# Patient Record
Sex: Female | Born: 1935 | Race: White | Hispanic: No | Marital: Married | Smoking: Never smoker
Health system: Southern US, Community
[De-identification: ages and names within clinical notes are randomized; demographics above are authoritative.]

## PROBLEM LIST (undated history)

## (undated) DIAGNOSIS — F329 Major depressive disorder, single episode, unspecified: Secondary | ICD-10-CM

## (undated) DIAGNOSIS — F32A Depression, unspecified: Secondary | ICD-10-CM

## (undated) DIAGNOSIS — M1990 Unspecified osteoarthritis, unspecified site: Secondary | ICD-10-CM

## (undated) DIAGNOSIS — H5440 Blindness, one eye, unspecified eye: Secondary | ICD-10-CM

## (undated) DIAGNOSIS — N189 Chronic kidney disease, unspecified: Secondary | ICD-10-CM

## (undated) DIAGNOSIS — I509 Heart failure, unspecified: Secondary | ICD-10-CM

## (undated) DIAGNOSIS — F419 Anxiety disorder, unspecified: Secondary | ICD-10-CM

## (undated) DIAGNOSIS — E039 Hypothyroidism, unspecified: Secondary | ICD-10-CM

## (undated) HISTORY — PX: CHOLECYSTECTOMY: SHX55

## (undated) HISTORY — PX: ABDOMINAL HYSTERECTOMY: SHX81

## (undated) HISTORY — PX: EYE SURGERY: SHX253

---

## 2004-09-08 ENCOUNTER — Ambulatory Visit: Payer: Self-pay | Admitting: Family Medicine

## 2004-09-21 ENCOUNTER — Ambulatory Visit: Payer: Self-pay | Admitting: Family Medicine

## 2005-11-30 ENCOUNTER — Ambulatory Visit: Payer: Self-pay | Admitting: Family Medicine

## 2006-02-28 ENCOUNTER — Encounter: Payer: Self-pay | Admitting: Family Medicine

## 2006-03-01 ENCOUNTER — Ambulatory Visit: Payer: Self-pay | Admitting: Family Medicine

## 2006-04-14 ENCOUNTER — Ambulatory Visit: Payer: Self-pay

## 2006-07-25 ENCOUNTER — Emergency Department: Payer: Self-pay | Admitting: Emergency Medicine

## 2007-06-01 ENCOUNTER — Ambulatory Visit: Payer: Self-pay

## 2009-02-28 ENCOUNTER — Emergency Department: Payer: Self-pay | Admitting: Emergency Medicine

## 2009-08-19 ENCOUNTER — Ambulatory Visit: Payer: Self-pay | Admitting: General Surgery

## 2009-08-20 ENCOUNTER — Ambulatory Visit: Payer: Self-pay | Admitting: General Surgery

## 2010-02-01 ENCOUNTER — Ambulatory Visit: Payer: Self-pay | Admitting: Family Medicine

## 2011-05-25 ENCOUNTER — Ambulatory Visit: Payer: Self-pay | Admitting: Family Medicine

## 2011-11-24 DIAGNOSIS — H4010X0 Unspecified open-angle glaucoma, stage unspecified: Secondary | ICD-10-CM | POA: Diagnosis not present

## 2011-11-24 DIAGNOSIS — H353290 Exudative age-related macular degeneration, unspecified eye, stage unspecified: Secondary | ICD-10-CM | POA: Diagnosis not present

## 2011-12-27 DIAGNOSIS — H40039 Anatomical narrow angle, unspecified eye: Secondary | ICD-10-CM | POA: Diagnosis not present

## 2012-01-12 DIAGNOSIS — I8310 Varicose veins of unspecified lower extremity with inflammation: Secondary | ICD-10-CM | POA: Diagnosis not present

## 2012-01-12 DIAGNOSIS — F411 Generalized anxiety disorder: Secondary | ICD-10-CM | POA: Diagnosis not present

## 2012-01-12 DIAGNOSIS — R609 Edema, unspecified: Secondary | ICD-10-CM | POA: Diagnosis not present

## 2012-01-12 DIAGNOSIS — E039 Hypothyroidism, unspecified: Secondary | ICD-10-CM | POA: Diagnosis not present

## 2012-01-27 DIAGNOSIS — E039 Hypothyroidism, unspecified: Secondary | ICD-10-CM | POA: Diagnosis not present

## 2012-01-27 DIAGNOSIS — R609 Edema, unspecified: Secondary | ICD-10-CM | POA: Diagnosis not present

## 2012-01-27 DIAGNOSIS — F411 Generalized anxiety disorder: Secondary | ICD-10-CM | POA: Diagnosis not present

## 2012-01-27 DIAGNOSIS — I8310 Varicose veins of unspecified lower extremity with inflammation: Secondary | ICD-10-CM | POA: Diagnosis not present

## 2012-04-14 DIAGNOSIS — Z23 Encounter for immunization: Secondary | ICD-10-CM | POA: Diagnosis not present

## 2013-04-11 DIAGNOSIS — Z23 Encounter for immunization: Secondary | ICD-10-CM | POA: Diagnosis not present

## 2013-06-07 DIAGNOSIS — I8310 Varicose veins of unspecified lower extremity with inflammation: Secondary | ICD-10-CM | POA: Diagnosis not present

## 2013-06-07 DIAGNOSIS — R609 Edema, unspecified: Secondary | ICD-10-CM | POA: Diagnosis not present

## 2013-06-07 DIAGNOSIS — E039 Hypothyroidism, unspecified: Secondary | ICD-10-CM | POA: Diagnosis not present

## 2013-06-07 DIAGNOSIS — F3289 Other specified depressive episodes: Secondary | ICD-10-CM | POA: Diagnosis not present

## 2013-06-07 DIAGNOSIS — F411 Generalized anxiety disorder: Secondary | ICD-10-CM | POA: Diagnosis not present

## 2013-06-07 DIAGNOSIS — F329 Major depressive disorder, single episode, unspecified: Secondary | ICD-10-CM | POA: Diagnosis not present

## 2013-06-07 LAB — TSH: TSH: 6.42 u[IU]/mL — AB (ref <=5.90)

## 2014-01-03 DIAGNOSIS — I8310 Varicose veins of unspecified lower extremity with inflammation: Secondary | ICD-10-CM | POA: Diagnosis not present

## 2014-01-03 DIAGNOSIS — F329 Major depressive disorder, single episode, unspecified: Secondary | ICD-10-CM | POA: Diagnosis not present

## 2014-01-03 DIAGNOSIS — R609 Edema, unspecified: Secondary | ICD-10-CM | POA: Diagnosis not present

## 2014-01-03 DIAGNOSIS — F3289 Other specified depressive episodes: Secondary | ICD-10-CM | POA: Diagnosis not present

## 2014-01-03 DIAGNOSIS — F411 Generalized anxiety disorder: Secondary | ICD-10-CM | POA: Diagnosis not present

## 2014-01-03 DIAGNOSIS — E039 Hypothyroidism, unspecified: Secondary | ICD-10-CM | POA: Diagnosis not present

## 2014-01-17 DIAGNOSIS — F3289 Other specified depressive episodes: Secondary | ICD-10-CM | POA: Diagnosis not present

## 2014-01-17 DIAGNOSIS — R609 Edema, unspecified: Secondary | ICD-10-CM | POA: Diagnosis not present

## 2014-01-17 DIAGNOSIS — E039 Hypothyroidism, unspecified: Secondary | ICD-10-CM | POA: Diagnosis not present

## 2014-01-17 DIAGNOSIS — F411 Generalized anxiety disorder: Secondary | ICD-10-CM | POA: Diagnosis not present

## 2014-01-17 DIAGNOSIS — F329 Major depressive disorder, single episode, unspecified: Secondary | ICD-10-CM | POA: Diagnosis not present

## 2014-01-17 DIAGNOSIS — R195 Other fecal abnormalities: Secondary | ICD-10-CM | POA: Diagnosis not present

## 2014-01-23 DIAGNOSIS — N289 Disorder of kidney and ureter, unspecified: Secondary | ICD-10-CM | POA: Diagnosis not present

## 2014-02-05 DIAGNOSIS — R6889 Other general symptoms and signs: Secondary | ICD-10-CM | POA: Diagnosis not present

## 2014-02-07 DIAGNOSIS — F3289 Other specified depressive episodes: Secondary | ICD-10-CM | POA: Diagnosis not present

## 2014-02-07 DIAGNOSIS — E039 Hypothyroidism, unspecified: Secondary | ICD-10-CM | POA: Diagnosis not present

## 2014-02-07 DIAGNOSIS — F329 Major depressive disorder, single episode, unspecified: Secondary | ICD-10-CM | POA: Diagnosis not present

## 2014-02-07 DIAGNOSIS — F411 Generalized anxiety disorder: Secondary | ICD-10-CM | POA: Diagnosis not present

## 2014-02-07 DIAGNOSIS — R609 Edema, unspecified: Secondary | ICD-10-CM | POA: Diagnosis not present

## 2014-03-07 DIAGNOSIS — E039 Hypothyroidism, unspecified: Secondary | ICD-10-CM | POA: Diagnosis not present

## 2014-03-07 DIAGNOSIS — F411 Generalized anxiety disorder: Secondary | ICD-10-CM | POA: Diagnosis not present

## 2014-03-07 DIAGNOSIS — R609 Edema, unspecified: Secondary | ICD-10-CM | POA: Diagnosis not present

## 2014-03-07 DIAGNOSIS — F329 Major depressive disorder, single episode, unspecified: Secondary | ICD-10-CM | POA: Diagnosis not present

## 2014-03-07 DIAGNOSIS — F3289 Other specified depressive episodes: Secondary | ICD-10-CM | POA: Diagnosis not present

## 2014-03-17 DIAGNOSIS — R5381 Other malaise: Secondary | ICD-10-CM | POA: Diagnosis not present

## 2014-03-17 DIAGNOSIS — M5137 Other intervertebral disc degeneration, lumbosacral region: Secondary | ICD-10-CM | POA: Diagnosis not present

## 2014-03-17 DIAGNOSIS — R5383 Other fatigue: Secondary | ICD-10-CM | POA: Diagnosis not present

## 2014-03-17 DIAGNOSIS — E039 Hypothyroidism, unspecified: Secondary | ICD-10-CM | POA: Diagnosis not present

## 2014-03-17 DIAGNOSIS — R609 Edema, unspecified: Secondary | ICD-10-CM | POA: Diagnosis not present

## 2014-05-14 DIAGNOSIS — Z23 Encounter for immunization: Secondary | ICD-10-CM | POA: Diagnosis not present

## 2014-08-27 DIAGNOSIS — H401414 Capsular glaucoma with pseudoexfoliation of lens, right eye, indeterminate stage: Secondary | ICD-10-CM | POA: Diagnosis not present

## 2014-10-13 DIAGNOSIS — R609 Edema, unspecified: Secondary | ICD-10-CM | POA: Diagnosis not present

## 2014-10-13 DIAGNOSIS — Z8739 Personal history of other diseases of the musculoskeletal system and connective tissue: Secondary | ICD-10-CM | POA: Diagnosis not present

## 2014-10-13 DIAGNOSIS — M79605 Pain in left leg: Secondary | ICD-10-CM | POA: Diagnosis not present

## 2014-10-13 LAB — CBC AND DIFFERENTIAL
HCT: 41 % (ref 36–46)
HEMOGLOBIN: 13.4 g/dL (ref 12.0–16.0)
Platelets: 229 10*3/uL (ref 150–399)
WBC: 4.9 10^3/mL

## 2014-10-13 LAB — BASIC METABOLIC PANEL
BUN: 30 mg/dL — AB (ref 4–21)
CREATININE: 1.2 mg/dL — AB (ref <=1.1)
Glucose: 105 mg/dL
Potassium: 4.8 mmol/L (ref 3.4–5.3)
SODIUM: 139 mmol/L (ref 137–147)

## 2014-10-29 DIAGNOSIS — H401414 Capsular glaucoma with pseudoexfoliation of lens, right eye, indeterminate stage: Secondary | ICD-10-CM | POA: Diagnosis not present

## 2014-11-03 DIAGNOSIS — R609 Edema, unspecified: Secondary | ICD-10-CM | POA: Diagnosis not present

## 2014-11-03 DIAGNOSIS — Z8739 Personal history of other diseases of the musculoskeletal system and connective tissue: Secondary | ICD-10-CM | POA: Diagnosis not present

## 2014-11-03 DIAGNOSIS — M79605 Pain in left leg: Secondary | ICD-10-CM | POA: Diagnosis not present

## 2014-11-19 DIAGNOSIS — N184 Chronic kidney disease, stage 4 (severe): Secondary | ICD-10-CM | POA: Insufficient documentation

## 2014-11-19 DIAGNOSIS — K921 Melena: Secondary | ICD-10-CM | POA: Insufficient documentation

## 2014-11-19 DIAGNOSIS — I872 Venous insufficiency (chronic) (peripheral): Secondary | ICD-10-CM | POA: Insufficient documentation

## 2014-11-19 DIAGNOSIS — M5137 Other intervertebral disc degeneration, lumbosacral region: Secondary | ICD-10-CM | POA: Insufficient documentation

## 2014-11-19 DIAGNOSIS — F32A Depression, unspecified: Secondary | ICD-10-CM | POA: Insufficient documentation

## 2014-11-19 DIAGNOSIS — Z8739 Personal history of other diseases of the musculoskeletal system and connective tissue: Secondary | ICD-10-CM | POA: Insufficient documentation

## 2014-11-19 DIAGNOSIS — M4310 Spondylolisthesis, site unspecified: Secondary | ICD-10-CM | POA: Insufficient documentation

## 2014-11-19 DIAGNOSIS — H5440 Blindness, one eye, unspecified eye: Secondary | ICD-10-CM | POA: Insufficient documentation

## 2014-11-19 DIAGNOSIS — R6889 Other general symptoms and signs: Secondary | ICD-10-CM | POA: Insufficient documentation

## 2014-11-19 DIAGNOSIS — M79605 Pain in left leg: Secondary | ICD-10-CM | POA: Insufficient documentation

## 2014-11-19 DIAGNOSIS — F419 Anxiety disorder, unspecified: Secondary | ICD-10-CM | POA: Insufficient documentation

## 2014-11-19 DIAGNOSIS — F329 Major depressive disorder, single episode, unspecified: Secondary | ICD-10-CM | POA: Insufficient documentation

## 2014-11-19 DIAGNOSIS — M1710 Unilateral primary osteoarthritis, unspecified knee: Secondary | ICD-10-CM | POA: Insufficient documentation

## 2014-11-19 DIAGNOSIS — R609 Edema, unspecified: Secondary | ICD-10-CM | POA: Insufficient documentation

## 2014-11-19 DIAGNOSIS — E039 Hypothyroidism, unspecified: Secondary | ICD-10-CM | POA: Insufficient documentation

## 2015-01-05 ENCOUNTER — Ambulatory Visit (INDEPENDENT_AMBULATORY_CARE_PROVIDER_SITE_OTHER): Payer: Medicare Other | Admitting: Family Medicine

## 2015-01-05 ENCOUNTER — Encounter: Payer: Self-pay | Admitting: Family Medicine

## 2015-01-05 ENCOUNTER — Ambulatory Visit: Payer: Self-pay | Admitting: Family Medicine

## 2015-01-05 VITALS — BP 98/64 | HR 86 | Temp 98.0°F | Resp 16 | Wt 159.0 lb

## 2015-01-05 DIAGNOSIS — M79605 Pain in left leg: Secondary | ICD-10-CM | POA: Diagnosis not present

## 2015-01-05 DIAGNOSIS — Z1389 Encounter for screening for other disorder: Secondary | ICD-10-CM | POA: Diagnosis not present

## 2015-01-05 DIAGNOSIS — M5137 Other intervertebral disc degeneration, lumbosacral region: Secondary | ICD-10-CM

## 2015-01-05 MED ORDER — HYDROCODONE-ACETAMINOPHEN 5-325 MG PO TABS: 1.0000 | ORAL_TABLET | Freq: Three times a day (TID) | ORAL | Status: DC

## 2015-01-05 NOTE — Progress Notes (Signed)
Subjective:    Patient ID: Brandy Schroeder, female    DOB: 1919-02-11, 81 y.o.   MRN: [id number]  HPI Follow-up and Medication Refill Left leg and left lower back continues to have pain and "tireness" with intermittent pain. Still getting good relief from Hydrocodone/APAP 1-2 times a day (usually average once a day). No problems with constipation. Notice some napping daily but doesn't feel it is related to medication. Patient Active Problem List   Diagnosis Date Noted  . Anxiety 11/19/2014  . 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 Degeneration of lumbar or lumbosacral intervertebral disc 11/19/2014  . Clinical depression 11/19/2014  . H/O: osteoarthritis 11/19/2014  . Adult hypothyroidism 11/19/2014  . Left leg pain 11/19/2014  . Primary localized osteoarthrosis, lower leg 11/19/2014  . Edema, peripheral 11/19/2014  . Impaired renal function 11/19/2014  . Acquired spondylolisthesis 11/19/2014  . Dermatitis, stasis 11/19/2014   Family History  Problem Relation Age of Onset  . Arthritis Mother   . Stroke Mother   . Heart disease Brother   . Arthritis Brother    History  Substance Use Topics  . Smoking status: Never Smoker   . Smokeless tobacco: Not on file  . Alcohol Use: No   Current Outpatient Prescriptions on File Prior to Visit  Medication Sig Dispense Refill  . Alpha Lipoic Acid 200 MG CAPS Take 1 capsule by mouth daily.    Marland Kitchen ALPRAZolam (XANAX) 0.5 MG tablet Take by mouth 2 (two) times daily as needed. 1/2 to 1 tablet by mouth twice daily as needed    . Cholecalciferol (VITAMIN D3) 2000 UNITS TABS Take 1 tablet by mouth daily.    . furosemide (LASIX) 40 MG tablet Take 1 tablet by mouth daily.    Marland Kitchen HYDROcodone-acetaminophen (NORCO/VICODIN) 5-325 MG per tablet Take 1 tablet by mouth. Up to three times daily as needed for pain    . potassium chloride SA (K-DUR,KLOR-CON) 20 MEQ tablet Take 1 tablet by mouth 2 (two) times daily.    . traZODone (DESYREL) 150 MG tablet Take 1 tablet  by mouth daily.     No current facility-administered medications on file prior to visit.   Allergies  Allergen Reactions  . Sulfa Antibiotics    Past Surgical History  Procedure Laterality Date  . Eye surgery    . Abdominal hysterectomy    . Cholecystectomy         Review of Systems  Constitutional: Negative.   HENT: Negative.   Eyes: Positive for itching.       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 "Eye doctor prescribes a drop."  Respiratory: Negative.   Cardiovascular: Negative.   Gastrointestinal: Negative.   Musculoskeletal: Positive for back pain.       Back and left foot/leg pain - chronic.       Objective:   Physical Exam  Constitutional: She is oriented to person, place, and time. She appears well-developed and well-nourished. No distress.  HENT:  Head: Normocephalic and atraumatic.  Right Ear: Hearing normal.  Left Ear: Hearing normal.  Nose: Nose normal.  Eyes: Conjunctivae and lids are normal. Right eye exhibits no discharge. Left eye exhibits no discharge. No scleral icterus.  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  Neck: Normal range of motion. Neck supple.  Cardiovascular: Normal rate and regular rhythm.   Pulmonary/Chest: Effort normal and breath sounds normal. No respiratory distress.  Abdominal: Soft. Bowel sounds are normal.  Musculoskeletal:  Left leg shorter than right with intermittent  pains. Normal pulses with slight 1+edema. No numbness.  Neurological: She is alert and oriented to person, place, and time.  Skin: Skin is intact. No lesion and no rash noted.  Psychiatric: She has a normal mood and affect. Her speech is normal and behavior is normal. Thought content normal.   BP 98/64 mmHg  Pulse 86  Temp(Src) 98 F (36.7 C) (Oral)  Resp 16  Wt 159 lb (72.122 kg)     Assessment & Plan:  1. Left leg pain Stable and intermittent. Continues to have good control with use of analgesic one tablet daily with rare second dose.  Denies constipation or confusion. Will refill and advised family to continue walker and watch to prevent falls. Recheck in 3 months. - HYDROcodone-acetaminophen (NORCO/VICODIN) 5-325 MG per tablet; Take 1 tablet by mouth 3 (three) times daily. Up to three times daily as needed for pain  Dispense: 90 tablet; Refill: 0  2. Degeneration of lumbar or lumbosacral intervertebral disc Some soreness in left lower lumbosacral area today. No numbness or radiation at the present. Recheck in 3 months. - HYDROcodone-acetaminophen (NORCO/VICODIN) 5-325 MG per tablet; Take 1 tablet by mouth 3 (three) times daily. Up to three times daily as needed for pain  Dispense: 90 tablet; Refill: 0

## 2015-02-16 ENCOUNTER — Other Ambulatory Visit: Payer: Self-pay | Admitting: Family Medicine

## 2015-02-16 NOTE — Telephone Encounter (Signed)
Last K+ serum lab was 4.8 on 10/13/2014. Pt asking for a K+ prescription refill.

## 2015-03-03 ENCOUNTER — Other Ambulatory Visit: Payer: Self-pay | Admitting: Family Medicine

## 2015-03-05 NOTE — Telephone Encounter (Signed)
Please call in her Xanax and Trazodone to her pharmacy so Claudine Mouton can pick it up for her (family member).

## 2015-03-05 NOTE — Telephone Encounter (Signed)
Called in prescriptions to [facility] as directed below.  Thanks.

## 2015-03-05 NOTE — Telephone Encounter (Signed)
Brandy Schroeder stated she needs the 2 refills sent in asap this morning because she had surgery and it is hard for her to get out. Thanks TNP

## 2015-03-16 ENCOUNTER — Other Ambulatory Visit: Payer: Self-pay | Admitting: Family Medicine

## 2015-03-16 NOTE — Telephone Encounter (Signed)
Pt contacted office for refill request on the following medications:  KLOR-CON M20 20 MEQ.  [facility] [address]  CB#[phone]/MW  This a pt of Dennis.

## 2015-03-16 NOTE — Telephone Encounter (Signed)
Brandy Schroeder patient 

## 2015-03-16 NOTE — Telephone Encounter (Signed)
Brandy patient 

## 2015-04-09 ENCOUNTER — Other Ambulatory Visit: Payer: Self-pay | Admitting: Family Medicine

## 2015-04-10 ENCOUNTER — Other Ambulatory Visit: Payer: Self-pay | Admitting: Family Medicine

## 2015-04-10 NOTE — Telephone Encounter (Signed)
Can work her in today or Monday to recheck and refill medications. By last refill amount, she seems to be using too much of the Xanax.

## 2015-04-10 NOTE — Telephone Encounter (Signed)
Pt's niece Rivka [name] called to request refills on HYDROcodone-acetaminophen (NORCO/VICODIN) 5-325 MG per tablet & ALPRAZolam (XANAX) 0.5 MG tablet for pt. Rivka [name] stated that the pharmacy told pt she needs an OV with our office. Rivka [name] stated that she needs to know as soon as possible if pt needs an OV before she can get her medication. Pt will be out of meds by Monday 04/13/15 and Rivka [name] is her only way to get to the office. Rivka [name] stated that she had to have surgery and it about to have surgery again so she needs a call back as soon as possible and would like to pick up the scripts today if possible. Rivka [name] would like a call back to let her know what she needs to do. Rivka [name] is on pt's DPR. Thanks TNP

## 2015-04-13 NOTE — Telephone Encounter (Signed)
Glenda called back and wants to talk to G[place]ier.  She said she is being misinformed from patient about her prescriptions.     Her call back is [phone]. Or [phone]  Thanks Barth Kirks

## 2015-04-13 NOTE — Telephone Encounter (Signed)
Brandy Schroeder states they finally counted Brandy Schroeder medication and she is NOT over using these prescriptions. She take only 1 of the Norco a day (rarely 2) and 1/2 to 1 of the Xanax tablets a day. She is not needing the refills right now. Advised her to physically count pills to help keep up with supplies and may need to use a pill organizer to limit use to the prescribed amount. Brandy Schroeder is worried about her memory becoming more of a problem. Seems appropriate for a 81 year old. Will come by the office or call back when needed. Recommend recheck appointment in the next 3 months.

## 2015-04-20 ENCOUNTER — Telehealth: Payer: Self-pay | Admitting: Family Medicine

## 2015-04-20 ENCOUNTER — Other Ambulatory Visit: Payer: Self-pay | Admitting: Family Medicine

## 2015-04-20 DIAGNOSIS — F419 Anxiety disorder, unspecified: Secondary | ICD-10-CM

## 2015-04-20 DIAGNOSIS — M5137 Other intervertebral disc degeneration, lumbosacral region: Secondary | ICD-10-CM

## 2015-04-20 DIAGNOSIS — M79605 Pain in left leg: Secondary | ICD-10-CM

## 2015-04-20 MED ORDER — ALPRAZOLAM 0.5 MG PO TABS: ORAL_TABLET | ORAL | Status: DC

## 2015-04-20 MED ORDER — HYDROCODONE-ACETAMINOPHEN 5-325 MG PO TABS: 1.0000 | ORAL_TABLET | Freq: Three times a day (TID) | ORAL | Status: DC

## 2015-04-20 NOTE — Telephone Encounter (Signed)
Pt contacted office for refill request on the following medications: ALPRAZolam (XANAX) 0.5 MG tablet & HYDROcodone-acetaminophen (NORCO/VICODIN) 5-325 MG per tablet. Pt would like to pick up both written RX's. Pt stated that when she contacted [facility] she was told they didn't have an RX for Xanax. Thanks TNP

## 2015-04-20 NOTE — Telephone Encounter (Signed)
Both prescriptions are available for pickup up front

## 2015-04-20 NOTE — Telephone Encounter (Signed)
Refill Request.  

## 2015-04-21 ENCOUNTER — Telehealth: Payer: Self-pay | Admitting: Family Medicine

## 2015-04-21 ENCOUNTER — Other Ambulatory Visit: Payer: Self-pay | Admitting: Family Medicine

## 2015-04-21 NOTE — Telephone Encounter (Signed)
Pt called back about RX. I advised that they were ready for pick up. Thanks TNP

## 2015-04-21 NOTE — Telephone Encounter (Signed)
Brandy Schroeder patient 

## 2015-04-21 NOTE — Telephone Encounter (Signed)
Unable to reach patient at this time, phone continues to ring, will try contacting again later.

## 2015-04-21 NOTE — Telephone Encounter (Signed)
It looks like you had this refilled yesterday... i can't really tell for sure.

## 2015-04-21 NOTE — Telephone Encounter (Signed)
Pt called for a refill on her HYDROcodone-acetaminophen (NORCO/VICODIN) 5-325 MG per tablet 04/20/15 -- Anola Gurney, PA Take 1 tablet by mouth 3 (three) times daily. Up to three times daily as needed for pain   ThanksTeri

## 2015-05-11 ENCOUNTER — Other Ambulatory Visit: Payer: Self-pay | Admitting: Family Medicine

## 2015-05-12 NOTE — Telephone Encounter (Signed)
Pt's niece called saying that they need to get patients potassium refilled.  [facility] pharmacy was suppose to send a request in.  She only has enough until Friday.  Thanks, Barth Kirkseri

## 2015-05-12 NOTE — Telephone Encounter (Signed)
Attempted to contact patient no answer nor voicemail.  

## 2015-05-12 NOTE — Telephone Encounter (Signed)
Need recheck of potassium level and recheck appointment soon. Sent refill to the pharmacy.

## 2015-05-12 NOTE — Telephone Encounter (Signed)
Last ov was on 01/05/2015. Last time this medication refill was on 03/16/2015.  thanks

## 2015-06-01 ENCOUNTER — Other Ambulatory Visit: Payer: Self-pay | Admitting: Family Medicine

## 2015-06-01 NOTE — Telephone Encounter (Signed)
Will write prescription for pick up at the front desk. Remind patient she is due for follow up appointment to recheck labs.

## 2015-06-01 NOTE — Telephone Encounter (Signed)
[facility] called [facility]. Attempted to contact patient. No answer nor voicemail.

## 2015-06-01 NOTE — Telephone Encounter (Signed)
See refill request.

## 2015-06-09 ENCOUNTER — Telehealth: Payer: Self-pay | Admitting: Family Medicine

## 2015-06-09 NOTE — Telephone Encounter (Signed)
Please advise 

## 2015-06-09 NOTE — Telephone Encounter (Signed)
Niece concerned about scratches on patient's leg and tenderness. Recommend she schedule appointment to evaluate for infection and need for antibiotic. She agreed to schedule appointment.

## 2015-06-09 NOTE — Telephone Encounter (Signed)
Pt's niece Rivka BarbaraGlenda would like [name] to return her call. Rivka BarbaraGlenda stated that pt has been scratching her leg and it seems swollen. Glenda wanted to discuss if she should bring pt in for an OV. Please advise. Thanks TNP

## 2015-06-11 ENCOUNTER — Encounter: Payer: Self-pay | Admitting: Family Medicine

## 2015-06-11 ENCOUNTER — Ambulatory Visit (INDEPENDENT_AMBULATORY_CARE_PROVIDER_SITE_OTHER): Payer: Medicare Other | Admitting: Family Medicine

## 2015-06-11 VITALS — BP 150/72 | HR 83 | Temp 97.6°F | Resp 16 | Wt 161.8 lb

## 2015-06-11 DIAGNOSIS — L089 Local infection of the skin and subcutaneous tissue, unspecified: Secondary | ICD-10-CM | POA: Diagnosis not present

## 2015-06-11 DIAGNOSIS — S80812A Abrasion, left lower leg, initial encounter: Secondary | ICD-10-CM | POA: Diagnosis not present

## 2015-06-11 MED ORDER — DOXYCYCLINE HYCLATE 100 MG PO TABS: 100.0000 mg | ORAL_TABLET | Freq: Two times a day (BID) | ORAL | Status: DC

## 2015-06-11 NOTE — Progress Notes (Signed)
Patient ID: Brandy Schroeder [place], female   DOB: 04/03/1919, 81 y.o.   MRN: [id number] Name: Brandy Schroeder Bleau   MRN: [id number]    DOB: 12/11/1918   Date:06/11/2015       Progress Note  Subjective  Chief Complaint  Chief Complaint  Patient presents with  . Stab Wound    left lower leg   HPI Leg Wound: Patient reports wound on lower left leg when she scratched at a sore 2-3 weeks ago. Wound is painful to the touch over the past week. Denies drainage and stopped using Band-Aids recently. History of frequent skin tears. No fever.   Patient Active Problem List   Diagnosis Date Noted  . Anxiety 11/19/2014  . 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 Black stool 11/19/2014  . Degeneration of lumbar or lumbosacral intervertebral disc 11/19/2014  . Clinical depression 11/19/2014  . H/O: osteoarthritis 11/19/2014  . Adult hypothyroidism 11/19/2014  . Left leg pain 11/19/2014  . Does not feel right 11/19/2014  . Primary localized osteoarthrosis, lower leg 11/19/2014  . Edema, peripheral 11/19/2014  . Impaired renal function 11/19/2014  . Acquired spondylolisthesis 11/19/2014  . Dermatitis, stasis 11/19/2014   Past Surgical History  Procedure Laterality Date  . Eye surgery    . Abdominal hysterectomy    . Cholecystectomy     Social History  Substance Use Topics  . Smoking status: Never Smoker   . Smokeless tobacco: Not on file  . Alcohol Use: No   Family History  Problem Relation Age of Onset  . Arthritis Mother   . Stroke Mother   . Heart disease Brother   . Arthritis Brother     Current outpatient prescriptions:  .  Alpha Lipoic Acid 200 MG CAPS, Take 1 capsule by mouth daily., Disp: , Rfl:  .  ALPRAZolam (XANAX) 0.5 MG tablet, TAKE ONE-HALF TO ONE TABLET BY MOUTH TWICE DAILY AS NEEDED, Disp: 60 tablet, Rfl: 0 .  Cholecalciferol (VITAMIN D3) 2000 UNITS TABS, Take 1 tablet by mouth daily., Disp: , Rfl:  .  furosemide (LASIX) 40 MG tablet, TAKE ONE TABLET BY MOUTH ONCE DAILY, Disp: 30  tablet, Rfl: 3 .  HYDROcodone-acetaminophen (NORCO/VICODIN) 5-325 MG per tablet, Take 1 tablet by mouth 3 (three) times daily. Up to three times daily as needed for pain, Disp: 90 tablet, Rfl: 0 .  KLOR-CON M20 20 MEQ tablet, TAKE ONE TABLET BY MOUTH TWICE DAILY, Disp: 60 tablet, Rfl: 0 .  traZODone (DESYREL) 150 MG tablet, TAKE ONE TABLET BY MOUTH ONCE DAILY, Disp: 30 tablet, Rfl: 3  Allergies  Allergen Reactions  . Sulfa Antibiotics    Review of Systems  Constitutional: Negative.   HENT: Negative.   Eyes: Negative.   Respiratory: Negative.   Cardiovascular: Negative.   Gastrointestinal: Negative.   Genitourinary: Negative.   Musculoskeletal: Negative.   Skin: Negative.   Neurological: Negative.   Endo/Heme/Allergies: Negative.    Objective  Filed Vitals:   06/11/15 1134  BP: 150/72  Pulse: 83  Temp: 97.6 F (36.4 C)  TempSrc: Oral  Resp: 16  Weight: 161 lb 12.8 oz (73.392 kg)  SpO2: 95%   Physical Exam  Constitutional: She is oriented to person, place, and time and well-developed, well-nourished, and in no distress.  HENT:  Head: Normocephalic.  Cardiovascular: Normal rate and regular rhythm.   Pulmonary/Chest: Effort normal and breath sounds normal.  Abdominal: Soft. Bowel sounds are normal.  Neurological: She is alert and oriented to person, place, and time.  Skin:  1 x 1.5 cm triangular scab on the lateral side of the left calf with surrounding erythema and bruising. Some puffiness of the left ankle. Negative Homan's sign.  Psychiatric: Affect normal.   Assessment & Plan  1. Infected abrasion of leg, left, initial encounter Onset over the past week after scratching at a scab. Triangular pattern suggests a skin tear. Erythema and swelling of wound indicates infection. Will treat with antibiotic and recommend Coban and gauze dressing to prevent further irritation to her thin skin. Recheck in 1 week if no better. - doxycycline (VIBRA-TABS) 100 MG tablet; Take 1  tablet (100 mg total) by mouth 2 (two) times daily.  Dispense: 20 tablet; Refill: 0

## 2015-06-23 ENCOUNTER — Other Ambulatory Visit: Payer: Self-pay | Admitting: Family Medicine

## 2015-06-23 DIAGNOSIS — M5137 Other intervertebral disc degeneration, lumbosacral region: Secondary | ICD-10-CM

## 2015-06-23 DIAGNOSIS — M79605 Pain in left leg: Secondary | ICD-10-CM

## 2015-06-23 NOTE — Telephone Encounter (Signed)
Pt contacted office for refill request on the following medications:  HYDROcodone-acetaminophen (NORCO/VICODIN) 5-325 MG per tablet.  CB#[phone]/MW

## 2015-06-25 ENCOUNTER — Other Ambulatory Visit: Payer: Self-pay

## 2015-06-25 MED ORDER — HYDROCODONE-ACETAMINOPHEN 5-325 MG PO TABS: 1.0000 | ORAL_TABLET | Freq: Three times a day (TID) | ORAL | Status: DC

## 2015-06-25 NOTE — Telephone Encounter (Signed)
Hydrocodone RX printed at front desk for pick up. Attempted to contact patient. No answer nor voicemail.

## 2015-06-26 NOTE — Telephone Encounter (Signed)
Patient advised.

## 2015-06-29 ENCOUNTER — Other Ambulatory Visit: Payer: Self-pay | Admitting: Family Medicine

## 2015-06-30 NOTE — Telephone Encounter (Signed)
Glenda Called again wanting to know if the Rx had been sent in.  She said they are coming to town tomorrow and need to pick it up then..  She called the pharmacy and they told her to tell us to call it in and it would be faster...  Thanks Fortune Brandsteri

## 2015-06-30 NOTE — Telephone Encounter (Signed)
Pt's niece Glenda called [facility] and they dont have the RX for the trazadone.  Can some one check on this and call her back.    Call back is [phone]  Thanks Barth Kirkseri

## 2015-07-16 ENCOUNTER — Other Ambulatory Visit: Payer: Self-pay | Admitting: Family Medicine

## 2015-07-16 NOTE — Telephone Encounter (Signed)
Brandy Schroeder.  

## 2015-07-16 NOTE — Telephone Encounter (Signed)
Printed, please fax or call in to pharmacy. Thank you.   

## 2015-07-21 ENCOUNTER — Other Ambulatory Visit: Payer: Self-pay | Admitting: Family Medicine

## 2015-09-05 ENCOUNTER — Encounter: Payer: Self-pay | Admitting: Emergency Medicine

## 2015-09-05 ENCOUNTER — Inpatient Hospital Stay
Admission: EM | Admit: 2015-09-05 | Discharge: 2015-09-08 | DRG: 558 | Disposition: A | Discharge status: Skilled Nursing Facility | Payer: Medicare Other | Attending: Internal Medicine | Admitting: Internal Medicine

## 2015-09-05 DIAGNOSIS — I129 Hypertensive chronic kidney disease with stage 1 through stage 4 chronic kidney disease, or unspecified chronic kidney disease: Secondary | ICD-10-CM | POA: Diagnosis present

## 2015-09-05 DIAGNOSIS — Z882 Allergy status to sulfonamides status: Secondary | ICD-10-CM | POA: Diagnosis not present

## 2015-09-05 DIAGNOSIS — T796XXA Traumatic ischemia of muscle, initial encounter: Secondary | ICD-10-CM | POA: Diagnosis not present

## 2015-09-05 DIAGNOSIS — E039 Hypothyroidism, unspecified: Secondary | ICD-10-CM | POA: Diagnosis present

## 2015-09-05 DIAGNOSIS — N183 Chronic kidney disease, stage 3 (moderate): Secondary | ICD-10-CM | POA: Diagnosis not present

## 2015-09-05 DIAGNOSIS — M1990 Unspecified osteoarthritis, unspecified site: Secondary | ICD-10-CM | POA: Diagnosis present

## 2015-09-05 DIAGNOSIS — Z79899 Other long term (current) drug therapy: Secondary | ICD-10-CM | POA: Diagnosis not present

## 2015-09-05 DIAGNOSIS — Z9181 History of falling: Secondary | ICD-10-CM | POA: Diagnosis not present

## 2015-09-05 DIAGNOSIS — H5442 Blindness, left eye, normal vision right eye: Secondary | ICD-10-CM | POA: Diagnosis present

## 2015-09-05 DIAGNOSIS — Z66 Do not resuscitate: Secondary | ICD-10-CM | POA: Diagnosis present

## 2015-09-05 DIAGNOSIS — N179 Acute kidney failure, unspecified: Secondary | ICD-10-CM | POA: Diagnosis present

## 2015-09-05 DIAGNOSIS — F329 Major depressive disorder, single episode, unspecified: Secondary | ICD-10-CM | POA: Diagnosis present

## 2015-09-05 DIAGNOSIS — N189 Chronic kidney disease, unspecified: Secondary | ICD-10-CM | POA: Diagnosis present

## 2015-09-05 DIAGNOSIS — M6281 Muscle weakness (generalized): Secondary | ICD-10-CM | POA: Diagnosis not present

## 2015-09-05 DIAGNOSIS — M6282 Rhabdomyolysis: Secondary | ICD-10-CM | POA: Diagnosis not present

## 2015-09-05 DIAGNOSIS — I480 Paroxysmal atrial fibrillation: Secondary | ICD-10-CM | POA: Diagnosis present

## 2015-09-05 DIAGNOSIS — F419 Anxiety disorder, unspecified: Secondary | ICD-10-CM | POA: Diagnosis present

## 2015-09-05 DIAGNOSIS — F32A Depression, unspecified: Secondary | ICD-10-CM | POA: Diagnosis present

## 2015-09-05 DIAGNOSIS — E860 Dehydration: Secondary | ICD-10-CM | POA: Diagnosis present

## 2015-09-05 DIAGNOSIS — R197 Diarrhea, unspecified: Secondary | ICD-10-CM

## 2015-09-05 DIAGNOSIS — R296 Repeated falls: Secondary | ICD-10-CM | POA: Diagnosis present

## 2015-09-05 DIAGNOSIS — R531 Weakness: Secondary | ICD-10-CM

## 2015-09-05 DIAGNOSIS — I4891 Unspecified atrial fibrillation: Secondary | ICD-10-CM | POA: Diagnosis not present

## 2015-09-05 HISTORY — DX: Anxiety disorder, unspecified: F41.9

## 2015-09-05 HISTORY — DX: Major depressive disorder, single episode, unspecified: F32.9

## 2015-09-05 HISTORY — DX: Chronic kidney disease, unspecified: N18.9

## 2015-09-05 HISTORY — DX: Depression, unspecified: F32.A

## 2015-09-05 HISTORY — DX: Hypothyroidism, unspecified: E03.9

## 2015-09-05 HISTORY — DX: Blindness, one eye, unspecified eye: H54.40

## 2015-09-05 HISTORY — DX: Unspecified osteoarthritis, unspecified site: M19.90

## 2015-09-05 LAB — CBC WITH DIFFERENTIAL/PLATELET
BASOS ABS: 0 10*3/uL (ref 0–0.1)
Basophils Relative: 0 %
EOS PCT: 0 %
Eosinophils Absolute: 0 10*3/uL (ref 0–0.7)
HEMATOCRIT: 35.2 % (ref 35.0–47.0)
Hemoglobin: 11.7 g/dL — ABNORMAL LOW (ref 12.0–16.0)
LYMPHS PCT: 7 %
Lymphs Abs: 0.4 10*3/uL — ABNORMAL LOW (ref 1.0–3.6)
MCH: 30.4 pg (ref 26.0–34.0)
MCHC: 33.3 g/dL (ref 32.0–36.0)
MCV: 91.1 fL (ref 80.0–100.0)
MONO ABS: 0.6 10*3/uL (ref 0.2–0.9)
MONOS PCT: 12 %
Neutro Abs: 4.2 10*3/uL (ref 1.4–6.5)
Neutrophils Relative %: 81 %
PLATELETS: 136 10*3/uL — AB (ref 150–440)
RBC: 3.86 MIL/uL (ref 3.80–5.20)
RDW: 13.8 % (ref 11.5–14.5)
WBC: 5.2 10*3/uL (ref 3.6–11.0)

## 2015-09-05 LAB — URINALYSIS COMPLETE WITH MICROSCOPIC (ARMC ONLY)
BILIRUBIN URINE: NEGATIVE
GLUCOSE, UA: NEGATIVE mg/dL
LEUKOCYTES UA: NEGATIVE
NITRITE: NEGATIVE
PH: 5 (ref 5.0–8.0)
Protein, ur: 100 mg/dL — AB
Specific Gravity, Urine: 1.018 (ref 1.005–1.030)

## 2015-09-05 LAB — GASTROINTESTINAL PANEL BY PCR, STOOL (REPLACES STOOL CULTURE)
ADENOVIRUS F40/41: NOT DETECTED
ASTROVIRUS: NOT DETECTED
CYCLOSPORA CAYETANENSIS: NOT DETECTED
Campylobacter species: NOT DETECTED
Cryptosporidium: NOT DETECTED
E. COLI O157: NOT DETECTED
ENTAMOEBA HISTOLYTICA: NOT DETECTED
ENTEROAGGREGATIVE E COLI (EAEC): NOT DETECTED
ENTEROPATHOGENIC E COLI (EPEC): NOT DETECTED
ENTEROTOXIGENIC E COLI (ETEC): NOT DETECTED
GIARDIA LAMBLIA: NOT DETECTED
NOROVIRUS GI/GII: NOT DETECTED
Plesimonas shigelloides: NOT DETECTED
Rotavirus A: NOT DETECTED
SAPOVIRUS (I, II, IV, AND V): NOT DETECTED
Salmonella species: NOT DETECTED
Shiga like toxin producing E coli (STEC): NOT DETECTED
Shigella/Enteroinvasive E coli (EIEC): NOT DETECTED
VIBRIO CHOLERAE: NOT DETECTED
VIBRIO SPECIES: NOT DETECTED
Yersinia enterocolitica: NOT DETECTED

## 2015-09-05 LAB — COMPREHENSIVE METABOLIC PANEL
ALT: 26 U/L (ref 14–54)
ANION GAP: 12 (ref 5–15)
AST: 118 U/L — ABNORMAL HIGH (ref 15–41)
Albumin: 3.9 g/dL (ref 3.5–5.0)
Alkaline Phosphatase: 45 U/L (ref 38–126)
BUN: 32 mg/dL — ABNORMAL HIGH (ref 6–20)
CHLORIDE: 101 mmol/L (ref 101–111)
CO2: 20 mmol/L — AB (ref 22–32)
Calcium: 8.3 mg/dL — ABNORMAL LOW (ref 8.9–10.3)
Creatinine, Ser: 1.61 mg/dL — ABNORMAL HIGH (ref 0.44–1.00)
GFR, EST AFRICAN AMERICAN: 30 mL/min — AB (ref >60)
GFR, EST NON AFRICAN AMERICAN: 26 mL/min — AB (ref >60)
Glucose, Bld: 143 mg/dL — ABNORMAL HIGH (ref 65–99)
POTASSIUM: 3.4 mmol/L — AB (ref 3.5–5.1)
SODIUM: 133 mmol/L — AB (ref 135–145)
Total Bilirubin: 0.9 mg/dL (ref 0.3–1.2)
Total Protein: 6.7 g/dL (ref 6.5–8.1)

## 2015-09-05 LAB — LIPASE, BLOOD: LIPASE: 17 U/L (ref 11–51)

## 2015-09-05 LAB — C DIFFICILE QUICK SCREEN W PCR REFLEX
C DIFFICILE (CDIFF) INTERP: NEGATIVE
C DIFFICILE (CDIFF) TOXIN: NEGATIVE
C Diff antigen: NEGATIVE

## 2015-09-05 LAB — CK: CK TOTAL: 4777 U/L — AB (ref 38–234)

## 2015-09-05 MED ORDER — ACETAMINOPHEN 325 MG PO TABS
650.0000 mg | ORAL_TABLET | Freq: Once | ORAL | Status: AC
  Administered 2015-09-05: 650 mg via ORAL
  Filled 2015-09-05: qty 2

## 2015-09-05 NOTE — ED Notes (Signed)
Per EMS patient has had multiple fall over the past 3 days. Patient has also had diarrhea for the past 3 days and has had decreased PO in take.

## 2015-09-05 NOTE — Care Management Obs Status (Signed)
MEDICARE OBSERVATION STATUS NOTIFICATION   Patient Details  Name: Brandy Schroeder MRN: [id number] Date of Birth: 1918/09/04   Medicare Observation Status Notification Given:  Yes    CrutchfieldDerrill Memo, RN 09/05/2015, 10:26 PM

## 2015-09-05 NOTE — ED Notes (Signed)
Called to give report, charge nurse unavailable, will call back with update on room.

## 2015-09-05 NOTE — ED Notes (Signed)
Patient brought in from [facility] by EMS for unwitnessed fall. Patient has also had diarrhea for 3 days.

## 2015-09-05 NOTE — H&P (Signed)
[facility] - [place] [facility]   PATIENT NAME: Brandy Schroeder    MR#:  [id number]  DATE OF BIRTH:  09-Apr-1919  DATE OF ADMISSION:  09/05/2015  PRIMARY CARE PHYSICIAN: Dortha Kern, PA   REQUESTING/REFERRING PHYSICIAN: Shaune Pollack, M.D.  CHIEF COMPLAINT:   Chief Complaint  Patient presents with  . Fall  . Diarrhea    HISTORY OF PRESENT ILLNESS:  Brandy Schroeder  is a 82 y.o. female who presents with multiple falls. Patient states that she's been having increasing falls with the past couple weeks, with 2 falls in the last 2 days. Her son is present with her in the ED and provides further information. He states that she lives in assisted living facility, and that he was called by staff there that she had fallen today. By time he got there they had gotten her up, and she had fallen again. She was brought to the ED for evaluation. When she arrived here she was found to be in atrial fibrillation with RVR, but then spontaneously converted to sinus rhythm. Patient is mildly dehydrated. CPK was elevated at 4700. On arrival patient was also mildly febrile, though her white blood cell count is not elevated. Her chest x-ray did not show any infiltrate, and her urinalysis did not indicate infection. She does state that she had an episode of watery diarrhea yesterday. Hospitalists were called for admission.  PAST MEDICAL HISTORY:   Past Medical History  Diagnosis Date  . Anxiety   . Blind left eye   . Depression   . Hypothyroidism   . OA (osteoarthritis)   . CKD (chronic kidney disease)     PAST SURGICAL HISTORY:   Past Surgical History  Procedure Laterality Date  . Eye surgery    . Abdominal hysterectomy    . Cholecystectomy      SOCIAL HISTORY:   Social History  Substance Use Topics  . Smoking status: Never Smoker   . Smokeless tobacco: Not on file  . Alcohol Use: No    FAMILY HISTORY:   Family History  Problem Relation Age of Onset  .  Arthritis Mother   . Stroke Mother   . Heart disease Brother   . Arthritis Brother     DRUG ALLERGIES:   Allergies  Allergen Reactions  . Sulfa Antibiotics     MEDICATIONS AT HOME:   Prior to Admission medications   Medication Sig Start Date End Date Taking? Authorizing Provider  Alpha Lipoic Acid 200 MG CAPS Take 1 capsule by mouth daily.    Historical Provider, MD  ALPRAZolam Prudy Feeler) 0.5 MG tablet TAKE ONE-HALF TO ONE TABLET BY MOUTH TWICE DAILY AS NEEDED 07/16/15   Lorie Phenix, MD  Cholecalciferol (VITAMIN D3) 2000 UNITS TABS Take 1 tablet by mouth daily.    Historical Provider, MD  doxycycline (VIBRA-TABS) 100 MG tablet Take 1 tablet (100 mg total) by mouth 2 (two) times daily. 06/11/15   Jodell Cipro Chrismon, PA  furosemide (LASIX) 40 MG tablet TAKE ONE TABLET BY MOUTH ONCE DAILY 06/01/15   Jodell Cipro Chrismon, PA  HYDROcodone-acetaminophen (NORCO/VICODIN) 5-325 MG tablet Take 1 tablet by mouth 3 (three) times daily. Up to three times daily as needed for pain 06/25/15   Jodell Cipro Chrismon, PA  KLOR-CON M20 20 MEQ tablet TAKE ONE TABLET BY MOUTH TWICE DAILY 06/25/15   Jodell Cipro Chrismon, PA  traZODone (DESYREL) 150 MG tablet TAKE ONE TABLET BY MOUTH ONCE DAILY 06/30/15   Tamsen Roers, PA  REVIEW OF SYSTEMS:  Review of Systems  Constitutional: Positive for fever. Negative for chills, weight loss and malaise/fatigue.  HENT: Negative for ear pain, hearing loss and tinnitus.   Eyes: Negative for blurred vision, double vision, pain and redness.  Respiratory: Negative for cough, hemoptysis and shortness of breath.   Cardiovascular: Negative for chest pain, palpitations, orthopnea and leg swelling.  Gastrointestinal: Positive for diarrhea. Negative for nausea, vomiting, abdominal pain and constipation.  Genitourinary: Negative for dysuria, frequency and hematuria.  Musculoskeletal: Positive for falls. Negative for back pain, joint pain and neck pain.  Skin:       No acne, rash, or  lesions  Neurological: Negative for dizziness, tremors, focal weakness and weakness.  Endo/Heme/Allergies: Negative for polydipsia. Does not bruise/bleed easily.  Psychiatric/Behavioral: Negative for depression. The patient is not nervous/anxious and does not have insomnia.      VITAL SIGNS:   Filed Vitals:   09/05/15 2030 09/05/15 2053 09/05/15 2100 09/05/15 2130  BP: 136/80  159/72 143/77  Pulse: 106  104 101  Temp:  100.1 F (37.8 C)    TempSrc:  Oral    Resp: 26  24 23   Height:      Weight:      SpO2: 94%  94% 96%   Wt Readings from Last 3 Encounters:  09/05/15 73.029 kg (161 lb)  06/11/15 73.392 kg (161 lb 12.8 oz)  01/05/15 72.122 kg (159 lb)    PHYSICAL EXAMINATION:  Physical Exam  Vitals reviewed. Constitutional: She is oriented to person, place, and time. She appears well-developed and well-nourished. No distress.  HENT:  Head: Normocephalic and atraumatic.  dry mucous membranes  Eyes: Conjunctivae and EOM are normal. Pupils are equal, round, and reactive to light. No scleral icterus.  Neck: Normal range of motion. Neck supple. No JVD present. No thyromegaly present.  Cardiovascular: Normal rate, regular rhythm and intact distal pulses.  Exam reveals no gallop and no friction rub.   No murmur heard. Respiratory: Effort normal and breath sounds normal. No respiratory distress. She has no wheezes. She has no rales.  GI: Soft. Bowel sounds are normal. She exhibits no distension. There is no tenderness.  Musculoskeletal: Normal range of motion. She exhibits no edema.  No arthritis, no gout  Lymphadenopathy:    She has no cervical adenopathy.  Neurological: She is alert and oriented to person, place, and time. No cranial nerve deficit.  No dysarthria, no aphasia  Skin: Skin is warm and dry. No rash noted. No erythema.  Scattered ecchymoses  Psychiatric: She has a normal mood and affect. Her behavior is normal. Judgment and thought content normal.    LABORATORY  PANEL:   CBC  Recent Labs Lab 09/05/15 1807  WBC 5.2  HGB 11.7*  HCT 35.2  PLT 136*   ------------------------------------------------------------------------------------------------------------------  Chemistries   Recent Labs Lab 09/05/15 1807  NA 133*  K 3.4*  CL 101  CO2 20*  GLUCOSE 143*  BUN 32*  CREATININE 1.61*  CALCIUM 8.3*  AST 118*  ALT 26  ALKPHOS 45  BILITOT 0.9   ------------------------------------------------------------------------------------------------------------------  Cardiac Enzymes No results for input(s): TROPONINI in the last 168 hours. ------------------------------------------------------------------------------------------------------------------  RADIOLOGY:  No results found.  EKG:   Orders placed or performed during the hospital encounter of 09/05/15  . ED EKG  . ED EKG  . EKG 12-Lead  . EKG 12-Lead    IMPRESSION AND PLAN:  Principal Problem:   Multiple falls - unclear initial etiology. There is a  new diagnosis of A. fib as below. She also mildly dehydrated on clinical exam with perhaps some orthostatic hypotension. Potentially some weakness after her initial falls due to her rhabdo. We'll have PT evaluate. Treat other problems as below Active Problems:   Rhabdomyolysis - continue fluids for now, trend CPK and renal function.   Anxiety - continue home meds   Paroxysmal a-fib (HCC) - currently in sinus rhythm. No prior diagnosis of the same. Monitor on telemetry. Echocardiogram. Pending echo results, might consider cardiology consult   Acute on chronic renal failure (HCC) - likely secondary to her rhabdo, avoid nephrotoxins, fluids as above, monitor.   Clinical depression - continue home meds   Adult hypothyroidism - continue home meds  All the records are reviewed and case discussed with ED provider. Management plans discussed with the patient and/or family.  DVT PROPHYLAXIS: SubQ heparin  GI PROPHYLAXIS:  None  ADMISSION STATUS: Inpatient  CODE STATUS: DNR Code Status History    This patient does not have a recorded code status. Please follow your organizational policy for patients in this situation.      TOTAL TIME TAKING CARE OF THIS PATIENT: 45 minutes.    Airam Heidecker FIELDING 09/05/2015, 10:25 PM  [facility]  Office  [phone]  CC: Primary care physician; Dortha Kern, PA

## 2015-09-05 NOTE — ED Notes (Signed)
Sterile strip applied to lt. Forarm, previous IV site.  Skin laceration 1 cm, no bleeding.

## 2015-09-05 NOTE — ED Provider Notes (Signed)
[facility] Emergency Department Provider Note   ____________________________________________  Time seen: Approximately 8:30 pm I have reviewed the triage vital signs and the triage nursing note.  HISTORY  Chief Complaint Fall and Diarrhea   Historian Patient, sister and son  HPI Brandy Schroeder is a 82 y.o. female who lives at an assisted living facility on her own, has had diarrhea for the past 3 days and increased generalized weakness with multiple falls over this time period. No significant nausea or vomiting or abdominal pain. No black or bloody stools. Watery diarrhea many times per day. It sounds like multiple other folks have the same infectious diarrhea right now. No reported fever. No trouble breathing. No altered mental status. The falls have resulted in injury or any head injury. She typically leans on her bottom because her legs are weak. Sometimes are moderate.    No past medical history on file.  Patient Active Problem List   Diagnosis Date Noted  . Anxiety 11/19/2014  . 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 Black stool 11/19/2014  . Degeneration of lumbar or lumbosacral intervertebral disc 11/19/2014  . Clinical depression 11/19/2014  . H/O: osteoarthritis 11/19/2014  . Adult hypothyroidism 11/19/2014  . Left leg pain 11/19/2014  . Does not feel right 11/19/2014  . Primary localized osteoarthrosis, lower leg 11/19/2014  . Edema, peripheral 11/19/2014  . Impaired renal function 11/19/2014  . Acquired spondylolisthesis 11/19/2014  . Dermatitis, stasis 11/19/2014    Past Surgical History  Procedure Laterality Date  . Eye surgery    . Abdominal hysterectomy    . Cholecystectomy      Current Outpatient Rx  Name  Route  Sig  Dispense  Refill  . Alpha Lipoic Acid 200 MG CAPS   Oral   Take 1 capsule by mouth daily.         Marland Kitchen ALPRAZolam (XANAX) 0.5 MG tablet      TAKE ONE-HALF TO ONE TABLET BY MOUTH TWICE DAILY AS NEEDED   60  tablet   0   . Cholecalciferol (VITAMIN D3) 2000 UNITS TABS   Oral   Take 1 tablet by mouth daily.         Marland Kitchen doxycycline (VIBRA-TABS) 100 MG tablet   Oral   Take 1 tablet (100 mg total) by mouth 2 (two) times daily.   20 tablet   0   . furosemide (LASIX) 40 MG tablet      TAKE ONE TABLET BY MOUTH ONCE DAILY   30 tablet   3   . HYDROcodone-acetaminophen (NORCO/VICODIN) 5-325 MG tablet   Oral   Take 1 tablet by mouth 3 (three) times daily. Up to three times daily as needed for pain   90 tablet   0   . KLOR-CON M20 20 MEQ tablet      TAKE ONE TABLET BY MOUTH TWICE DAILY   60 tablet   5   . traZODone (DESYREL) 150 MG tablet      TAKE ONE TABLET BY MOUTH ONCE DAILY   30 tablet   3     Allergies Sulfa antibiotics  Family History  Problem Relation Age of Onset  . Arthritis Mother   . Stroke Mother   . Heart disease Brother   . Arthritis Brother     Social History Social History  Substance Use Topics  . Smoking status: Never Smoker   . Smokeless tobacco: None  . Alcohol Use: No    Review of Systems  Constitutional:  Negative for fever. Eyes: Negative for visual changes. ENT: Negative for sore throat. Cardiovascular: Negative for chest pain. Respiratory: Negative for shortness of breath. Gastrointestinal: Negative for abdominal pain Genitourinary: Negative for dysuria. Musculoskeletal: Negative for back pain. Skin: Negative for rash. Neurological: Negative for headache. 10 point Review of Systems otherwise negative ____________________________________________   PHYSICAL EXAM:  VITAL SIGNS: ED Triage Vitals  Enc Vitals Group     BP 09/05/15 1801 158/76 mmHg     Pulse Rate 09/05/15 1801 116     Resp 09/05/15 1801 20     Temp 09/05/15 1801 100.8 F (38.2 C)     Temp Source 09/05/15 1801 Rectal     SpO2 09/05/15 1801 95 %     Weight 09/05/15 1801 161 lb (73.029 kg)     Height 09/05/15 1801  (1.651 m)     Head Cir --      Peak Flow --       Pain Score 09/05/15 1752 0     Pain Loc --      Pain Edu? --      Excl. in GC? --      Constitutional: Alert and oriented. Well appearing and in no distress. HEENT   Head: Normocephalic and atraumatic.      Eyes: Conjunctivae are normal. PERRL. Normal extraocular movements.      Ears:         Nose: No congestion/rhinnorhea.   Mouth/Throat: Mucous membranes are moist.   Neck: No stridor. Cardiovascular/Chest: Tachycardic and irregularly irregular..  No murmurs, rubs, or gallops. Respiratory: Normal respiratory effort without tachypnea nor retractions. Breath sounds are clear and equal bilaterally. No wheezes/rales/rhonchi. Gastrointestinal: Soft. No distention, no guarding, no rebound. Nontender.    Genitourinary/rectal:Deferred Musculoskeletal: Nontender with normal range of motion in all extremities. No joint effusions.  No lower extremity tenderness.  No edema. Neurologic:  Normal speech and language. No gross or focal neurologic deficits are appreciated. Skin:  Skin is warm, dry and intact. No rash noted. Psychiatric: Mood and affect are normal. Speech and behavior are normal. Patient exhibits appropriate insight and judgment.  ____________________________________________   EKG I, Governor Rooks, MD, the attending physician have personally viewed and interpreted all ECGs.  1 15 bpm. Undetermined rhythm, but appears to be sinus tachycardia with PVC. Narrow QRS. Normal axis. Nonspecific ST and T-wave ____________________________________________  LABS (pertinent positives/negatives)  Lipase 17 Comprehensive metabolic panel significant for sodium 133, potassium 3.4, CO2 20, BUN 32 and creatinine 1.61 Urinalysis rare bacteria otherwise negative for signs of infection. Trace ketones C. difficile negative White blood count 5.2, hemoglobin 11.7 and platelet count 136  ____________________________________________  RADIOLOGY All Xrays were viewed by me. Imaging  interpreted by Radiologist.  None __________________________________________  PROCEDURES  Procedure(s) performed: None  Critical Care performed: None  ____________________________________________   ED COURSE / ASSESSMENT AND PLAN  Pertinent labs & imaging results that were available during my care of the patient were reviewed by me and considered in my medical decision making (see chart for details).   It sounds like her diarrhea is probably a viral infectious given what's going around in the community and at her facility. She doesn't have fever here and her blood pressure has been up. No hypotension. Labs show mild acute renal failure with mildly low sodium and potassium. She was given IV fluid bolus here in the emergency department. She still remains extremely weak generally without focal weakness. I do think she needs hospital observation admission for fluid  hydration, and likely physical therapy evaluation for generalized weakness.  On orthostatic vitals her heart rate did go up, however she is hypertensive. Her temp was 100.1 and she was given Tylenol. She had trouble standing due to generalized weakness.    CONSULTATIONS:   Hospitalist for admission   Patient / Family / Caregiver informed of clinical course, medical decision-making process, and agree with plan.     ___________________________________________   FINAL CLINICAL IMPRESSION(S) / ED DIAGNOSES   Final diagnoses:  Diarrhea, unspecified type  Generalized weakness  Multiple falls              Note: This dictation was prepared with Dragon dictation. Any transcriptional errors that result from this process are unintentional   Governor Rooks, MD 09/05/15 2102

## 2015-09-06 ENCOUNTER — Inpatient Hospital Stay: Admit: 2015-09-06 | Payer: Medicare Other

## 2015-09-06 LAB — BASIC METABOLIC PANEL
ANION GAP: 6 (ref 5–15)
BUN: 30 mg/dL — ABNORMAL HIGH (ref 6–20)
CO2: 24 mmol/L (ref 22–32)
Calcium: 8.2 mg/dL — ABNORMAL LOW (ref 8.9–10.3)
Chloride: 109 mmol/L (ref 101–111)
Creatinine, Ser: 1.24 mg/dL — ABNORMAL HIGH (ref 0.44–1.00)
GFR calc Af Amer: 42 mL/min — ABNORMAL LOW (ref >60)
GFR, EST NON AFRICAN AMERICAN: 36 mL/min — AB (ref >60)
Glucose, Bld: 93 mg/dL (ref 65–99)
POTASSIUM: 3.6 mmol/L (ref 3.5–5.1)
SODIUM: 139 mmol/L (ref 135–145)

## 2015-09-06 LAB — TSH: TSH: 3.399 u[IU]/mL (ref 0.350–4.500)

## 2015-09-06 LAB — CBC
HEMATOCRIT: 35.3 % (ref 35.0–47.0)
HEMOGLOBIN: 12 g/dL (ref 12.0–16.0)
MCH: 30.5 pg (ref 26.0–34.0)
MCHC: 33.9 g/dL (ref 32.0–36.0)
MCV: 89.9 fL (ref 80.0–100.0)
Platelets: 140 10*3/uL — ABNORMAL LOW (ref 150–440)
RBC: 3.93 MIL/uL (ref 3.80–5.20)
RDW: 14 % (ref 11.5–14.5)
WBC: 4.3 10*3/uL (ref 3.6–11.0)

## 2015-09-06 LAB — MRSA PCR SCREENING: MRSA BY PCR: NEGATIVE

## 2015-09-06 LAB — CK: Total CK: 5360 U/L — ABNORMAL HIGH (ref 38–234)

## 2015-09-06 MED ORDER — ONDANSETRON HCL 4 MG PO TABS: 4.0000 mg | ORAL_TABLET | Freq: Four times a day (QID) | ORAL | Status: DC | PRN

## 2015-09-06 MED ORDER — SODIUM CHLORIDE 0.9% FLUSH
3.0000 mL | Freq: Two times a day (BID) | INTRAVENOUS | Status: DC
  Administered 2015-09-06 – 2015-09-07 (×2): 3 mL via INTRAVENOUS

## 2015-09-06 MED ORDER — HEPARIN SODIUM (PORCINE) 5000 UNIT/ML IJ SOLN
5000.0000 [IU] | Freq: Three times a day (TID) | INTRAMUSCULAR | Status: DC
  Administered 2015-09-06 – 2015-09-08 (×7): 5000 [IU] via SUBCUTANEOUS
  Filled 2015-09-06 (×7): qty 1

## 2015-09-06 MED ORDER — TRAZODONE HCL 50 MG PO TABS
150.0000 mg | ORAL_TABLET | Freq: Every day | ORAL | Status: DC
  Administered 2015-09-06 – 2015-09-07 (×2): 150 mg via ORAL
  Filled 2015-09-06 (×3): qty 1

## 2015-09-06 MED ORDER — ACETAMINOPHEN 650 MG RE SUPP: 650.0000 mg | Freq: Four times a day (QID) | RECTAL | Status: DC | PRN

## 2015-09-06 MED ORDER — ALPRAZOLAM 0.25 MG PO TABS
0.2500 mg | ORAL_TABLET | Freq: Two times a day (BID) | ORAL | Status: DC | PRN
  Administered 2015-09-07: 0.25 mg via ORAL
  Filled 2015-09-06: qty 1

## 2015-09-06 MED ORDER — ACETAMINOPHEN 325 MG PO TABS
650.0000 mg | ORAL_TABLET | Freq: Four times a day (QID) | ORAL | Status: DC | PRN
  Administered 2015-09-06 – 2015-09-07 (×2): 650 mg via ORAL
  Filled 2015-09-06 (×2): qty 2

## 2015-09-06 MED ORDER — SODIUM CHLORIDE 0.9 % IV SOLN
INTRAVENOUS | Status: AC
  Administered 2015-09-06: 01:00:00 via INTRAVENOUS

## 2015-09-06 MED ORDER — ONDANSETRON HCL 4 MG/2ML IJ SOLN: 4.0000 mg | Freq: Four times a day (QID) | INTRAMUSCULAR | Status: DC | PRN

## 2015-09-06 NOTE — Evaluation (Signed)
Physical Therapy Evaluation Patient Details Name: Brandy Schroeder MRN: [id number] DOB: 1919/01/17 Today's Date: 09/06/2015   History of Present Illness  82 yo Female came to ALF ([facility]) with frequent falls and diarrhea. Patient has fallen approximately 3 times in last few days. She also has had diarrhea for last 3 days. She tested negative for C-diff and was diagnosed with virus.   Clinical Impression  82 yo Female came to ED with repeated falls. Patient was living at ALF ([facility]) and was mod I for most ADLs using rollator. She has been falling for last several days. She also was diagnosed with stomach virus with diarrhea for last 3 days. Patient exhibits significant weakness in BLE grossly 3-/5 with difficulty initiating BLE movement. She is min A for bed mobility but is very slow with movement. She is mod A for sit<>Stand transfer; Patient ambulated 3 feet with RW, mod A with cues for walker placement and to increase hip extension for better balance; She has a significant leg length discrepancy at baseline (approximately 4+ inches); She has a shoe with shoe lift but patient reports that she was not wearing her shoes when she fell. Patient would benefit from additional skilled PT intervention to improve LE strength, balance and gait safety. Her clinical presentation is evolving as she has new and persistent diarrhea with recent multiple falls and significant acute onset of BLE weakness.     Follow Up Recommendations SNF    Equipment Recommendations  None recommended by PT    Recommendations for Other Services       Precautions / Restrictions Precautions Precautions: Fall Restrictions Weight Bearing Restrictions: No      Mobility  Bed Mobility Overal bed mobility: Needs Assistance Bed Mobility: Supine to Sit     Supine to sit: HOB elevated;Min assist     General bed mobility comments: x1 rep; with mod VCs for hand and foot placement; has difficulty initiating bed  mobility due to weakness;   Transfers Overall transfer level: Needs assistance Equipment used: Rolling walker (2 wheeled) Transfers: Sit to/from Stand Sit to Stand: Mod assist         General transfer comment: without shoes; Patient has significant leg length discrepancy and has shoes with shoe lift (approx 4 inch); she reports being able to transfer without shoes; was wearing non-skid socks; However patient had increased difficulty with sit<>Stand transfer; Required mod VCs to increase forward trunk lean and push through BLE; difficulty initiating hip extension once standing;   Ambulation/Gait Ambulation/Gait assistance: Mod assist Ambulation Distance (Feet): 3 Feet Assistive device: Rolling walker (2 wheeled) Gait Pattern/deviations: Step-to pattern;Decreased step length - right;Narrow base of support Gait velocity: decreased   General Gait Details: has uneven leg length; wasn't wearing shoe lift; had difficulty shifting weight to LLE due to shorter leg; very unsteady balance; required mod A for managing RW  Stairs            Wheelchair Mobility    Modified Rankin (Stroke Patients Only)       Balance Overall balance assessment: Needs assistance Sitting-balance support: Bilateral upper extremity supported Sitting balance-Leahy Scale: Poor Sitting balance - Comments: demonstrates posterior lean and right lateral trunk lean requiring min A to maintain sitting balance edge of bed; dynamic sitting balance is poor;  Postural control: Posterior lean;Right lateral lean Standing balance support: Bilateral upper extremity supported Standing balance-Leahy Scale: Poor Standing balance comment: requires mod A for balance; demonstrates decreased weight shift to left side and forward flexed  posture; difficulty iniating hip extension due to weakness;                              Pertinent Vitals/Pain Pain Assessment: 0-10 Pain Location: pain in RLE; unable to rate pain;  reports that it hurts some;  Pain Intervention(s): Limited activity within patient's tolerance;Monitored during session;Repositioned;Patient requesting pain meds-RN notified    Home Living Family/patient expects to be discharged to:: Assisted living Living Arrangements: Alone             Home Equipment: Dan Humphreys - 2 wheels;Walker - 4 wheels Additional Comments: used rollator most of time;     Prior Function Level of Independence: Independent with assistive device(s)         Comments: was able to do all bathing/dressing mod I; Patient did go to dining room for meals; Has fallen 3 times in last few days with increased dependence for mobility;      Hand Dominance        Extremity/Trunk Assessment   Upper Extremity Assessment: Generalized weakness           Lower Extremity Assessment: RLE deficits/detail;LLE deficits/detail RLE Deficits / Details: hip 3-/5, knee 3/5, ankle 3-/5; intact light touch sensation;  LLE Deficits / Details: hip 3-/5, knee 3/5, ankle 3-/5; intact light touch sensation;      Communication   Communication: HOH  Cognition Arousal/Alertness: Awake/alert Behavior During Therapy: WFL for tasks assessed/performed Overall Cognitive Status: Within Functional Limits for tasks assessed                      General Comments General comments (skin integrity, edema, etc.): increased bruising noted on right lower leg;     Exercises Other Exercises Other Exercises: Initiated HEP: sitting: ankle pumps x10, quad sets 3 sec hold x5; hip abduction/adduction to midline x10; Patient required mod VCs to increase ROM for better strengthening; Also utilized teachback for learning;       Assessment/Plan    PT Assessment Patient needs continued PT services  PT Diagnosis Difficulty walking;Abnormality of gait;Generalized weakness   PT Problem List Decreased strength;Decreased range of motion;Obesity;Decreased knowledge of use of DME;Decreased activity  tolerance;Decreased safety awareness;Pain;Decreased balance;Decreased mobility  PT Treatment Interventions DME instruction;Balance training;Gait training;Neuromuscular re-education;Patient/family education;Functional mobility training;Therapeutic activities;Therapeutic exercise   PT Goals (Current goals can be found in the Care Plan section) Acute Rehab PT Goals Patient Stated Goal: "I want to be able to walk again" PT Goal Formulation: With patient Time For Goal Achievement: 09/20/15 Potential to Achieve Goals: Fair    Frequency Min 2X/week   Barriers to discharge Decreased caregiver support was at ALF; no assistance at ALF for dressing/bathing    Co-evaluation               End of Session Equipment Utilized During Treatment: Gait belt Activity Tolerance: Patient limited by fatigue Patient left: in chair;with call bell/phone within reach;with chair alarm set;with family/visitor present Nurse Communication: Mobility status         Time:  -      Charges:   PT Evaluation $PT Eval Moderate Complexity: 1 Procedure     PT G Codes:        Trotter,Margaret PT, DPT 09/06/2015, 3:39 PM

## 2015-09-06 NOTE — Progress Notes (Signed)
[facility] Physicians - [place] at [facility]   PATIENT NAME: Brandy Schroeder    MR#:  [id number]  DATE OF BIRTH:  01/20/1919  SUBJECTIVE:  CHIEF COMPLAINT:   Chief Complaint  Patient presents with  . Fall  . Diarrhea   Loose stool once today. Generalized weakness. REVIEW OF SYSTEMS:  CONSTITUTIONAL: No fever, has generalized weakness.  EYES: No blurred or double vision.  EARS, NOSE, AND THROAT: No tinnitus or ear pain.  RESPIRATORY: No cough, shortness of breath, wheezing or hemoptysis.  CARDIOVASCULAR: No chest pain, orthopnea, edema.  GASTROINTESTINAL: No nausea, vomiting, or abdominal pain. But has diarrhea.  GENITOURINARY: No dysuria, hematuria.  ENDOCRINE: No polyuria, nocturia,  HEMATOLOGY: No anemia, easy bruising or bleeding SKIN: No rash or lesion. MUSCULOSKELETAL: No joint pain or arthritis.   NEUROLOGIC: No tingling, numbness, weakness.  PSYCHIATRY: No anxiety or depression.   DRUG ALLERGIES:   Allergies  Allergen Reactions  . Sulfa Antibiotics     VITALS:  Blood pressure 157/56, pulse 88, temperature 98.2 F (36.8 C), temperature source Oral, resp. rate 18, height  (1.651 m), weight 73.029 kg (161 lb), SpO2 99 %.  PHYSICAL EXAMINATION:  GENERAL:  82 y.o.-year-old patient lying in the bed with no acute distress.  EYES: Pupils equal, round, reactive to light and accommodation. No scleral icterus. Extraocular muscles intact.  HEENT: Head atraumatic, normocephalic. Oropharynx and nasopharynx clear. Dry oral mucosa. NECK:  Supple, no jugular venous distention. No thyroid enlargement, no tenderness.  LUNGS: Normal breath sounds bilaterally, no wheezing, rales,rhonchi or crepitation. No use of accessory muscles of respiration.  CARDIOVASCULAR: S1, S2 normal. No murmurs, rubs, or gallops.  ABDOMEN: Soft, nontender, nondistended. Bowel sounds present. No organomegaly or mass.  EXTREMITIES: No pedal edema, cyanosis, or clubbing.  NEUROLOGIC:  Cranial nerves II through XII are intact. Muscle strength 5/5 in all extremities. Sensation intact. Gait not checked.  PSYCHIATRIC: The patient is alert and oriented x 3.  SKIN: No obvious rash, lesion, or ulcer.    LABORATORY PANEL:   CBC  Recent Labs Lab 09/06/15 0601  WBC 4.3  HGB 12.0  HCT 35.3  PLT 140*   ------------------------------------------------------------------------------------------------------------------  Chemistries   Recent Labs Lab 09/05/15 1807 09/06/15 0601  NA 133* 139  K 3.4* 3.6  CL 101 109  CO2 20* 24  GLUCOSE 143* 93  BUN 32* 30*  CREATININE 1.61* 1.24*  CALCIUM 8.3* 8.2*  AST 118*  --   ALT 26  --   ALKPHOS 45  --   BILITOT 0.9  --    ------------------------------------------------------------------------------------------------------------------  Cardiac Enzymes No results for input(s): TROPONINI in the last 168 hours. ------------------------------------------------------------------------------------------------------------------  RADIOLOGY:  No results found.  EKG:   Orders placed or performed during the hospital encounter of 09/05/15  . ED EKG  . ED EKG  . EKG 12-Lead  . EKG 12-Lead    ASSESSMENT AND PLAN:   Multiple falls - unclear initial etiology. Possible due to dehydration and weakness. Follow-up PT evaluation.  Rhabdomyolysis due to fall. CKs are still high on 5000, continue fluids and follow-up CPK and renal function.  Acute on chronic renal failure. - likely secondary to dehydration or rhabdo, avoid nephrotoxins, fluids, follow-up BMP.  Paroxysmal a-fib. Now in sinus rhythm. No prior diagnosis of the same. Follow-up Echocardiogram.  Anxiety - continue home meds Clinical depression - continue home meds  Adult hypothyroidism - continue home meds   All the records are reviewed and case discussed with Care Management/Social  Workerr. Management plans discussed with the patient, her son and sister and  they are in agreement.  CODE STATUS: DO NOT RESUSCITATE  TOTAL TIME TAKING CARE OF THIS PATIENT: 39 minutes.  Greater than 50% time was spent on coordination of care and face-to-face counseling.  POSSIBLE D/C IN 2-3 DAYS, DEPENDING ON CLINICAL CONDITION.   Shaune Pollack M.D on 09/06/2015 at 2:16 PM  Between 7am to 6pm - Pager - [phone]  After 6pm go to [email or web address] - password [email or web address] [facility]  [place] Hospitalists  Office  [phone]  CC: Primary care physician; Dortha Kern, PA

## 2015-09-07 ENCOUNTER — Inpatient Hospital Stay
Admit: 2015-09-07 | Discharge: 2015-09-07 | Disposition: A | Discharge status: Home / Self Care | Payer: Medicare Other | Attending: Internal Medicine | Admitting: Internal Medicine

## 2015-09-07 LAB — BASIC METABOLIC PANEL
Anion gap: 6 (ref 5–15)
BUN: 26 mg/dL — ABNORMAL HIGH (ref 6–20)
CALCIUM: 8.2 mg/dL — AB (ref 8.9–10.3)
CHLORIDE: 110 mmol/L (ref 101–111)
CO2: 23 mmol/L (ref 22–32)
CREATININE: 1.17 mg/dL — AB (ref 0.44–1.00)
GFR calc Af Amer: 45 mL/min — ABNORMAL LOW (ref >60)
GFR calc non Af Amer: 39 mL/min — ABNORMAL LOW (ref >60)
GLUCOSE: 103 mg/dL — AB (ref 65–99)
Potassium: 3.7 mmol/L (ref 3.5–5.1)
Sodium: 139 mmol/L (ref 135–145)

## 2015-09-07 LAB — CK: Total CK: 3839 U/L — ABNORMAL HIGH (ref 38–234)

## 2015-09-07 LAB — MAGNESIUM: Magnesium: 2 mg/dL (ref 1.7–2.4)

## 2015-09-07 MED ORDER — SODIUM CHLORIDE 0.9 % IV SOLN
INTRAVENOUS | Status: AC
  Administered 2015-09-07 – 2015-09-08 (×2): via INTRAVENOUS

## 2015-09-07 NOTE — Clinical Social Work Note (Signed)
Clinical Social Work Assessment  Patient Details  Name: Brandy Schroeder MRN: [id number] Date of Birth: [birth year]  Date of referral:  09/07/15               Reason for consult:  Discharge Planning                Permission sought to share information with:  Family Supports Permission granted to share information::  Yes, Verbal Permission Granted  Name::        Agency::     Relationship::   Woodfin Ganja (Daughter) [phone] and Benn Moulder (son-in-law))  Contact Information:     Housing/Transportation Living arrangements for the past 2 months:  [place] of Information:  Patient Patient Interpreter Needed:  None Criminal Activity/Legal Involvement Pertinent to Current Situation/Hospitalization:  No - Comment as needed Significant Relationships:  Adult Children Woodfin Ganja (Daughter) [phone] Marylene Land (son-in-law), & Luetta Nutting (Sister) [phone]) Lives with:  Self Do you feel safe going back to the place where you live?  Yes Need for family participation in patient care:  Yes (Comment) Woodfin Ganja (Daughter) [phone] Marylene Land (son-in-law), & Luetta Nutting (Sister) [phone])  Care giving concerns:  PT is recommending SNF placement.    Social Worker assessment / plan:  CSW was consulted because PT is recommending SNF placement. CSW met with patient and her sister Luetta Nutting [phone]) at bedside. CSW explained her role. Patient is in agreement to go to SNF at discharge. She reports her preference is [facility] or [facility]. Informed that patient's daughter and son-in-law (Ava and Alphia Kava) makes patient's medical decisions. Verbal permission to contact her daughter and son-in-law. Verbal Permission granted to send SNF referral to SNFs in [facility].   CSW contacted patient's daughter and son-in-law. State their preference is [facility] and [facility].   FL2 completed and faxed to SNFs in [facility]. PASRR pending. Awaiting bed  offers. CSW will continue to follow and assist.   Employment status:  Retired Forensic scientist:  Medicare PT Recommendations:  [place] / Referral to community resources:  [place]  Patient/Family's Response to care:  Patient is in agreement to SNF at discharge.   Patient/Family's Understanding of and Emotional Response to Diagnosis, Current Treatment, and Prognosis:  Patient reports that she understands CSW's role. She stated that she's appreciative for CSW's assistance and is pleased with her care at [facility].   Emotional Assessment Appearance:  Appears stated age Attitude/Demeanor/Rapport:   (None) Affect (typically observed):  Pleasant, Calm, Accepting Orientation:  Oriented to Self, Oriented to Place, Oriented to  Time, Oriented to Situation Alcohol / Substance use:  Not Applicable Psych involvement (Current and /or in the community):  No (Comment)  Discharge Needs  Concerns to be addressed:  Discharge Planning Concerns Readmission within the last 30 days:  No Current discharge risk:  Chronically ill Barriers to Discharge:  Continued Medical Work up   [employer], LCSW 09/07/2015, 12:12 PM

## 2015-09-07 NOTE — Progress Notes (Signed)
*  PRELIMINARY RESULTS* Echocardiogram 2D Echocardiogram has been performed.  Georgann Housekeeper Hege 09/07/2015, 11:14 AM

## 2015-09-07 NOTE — Progress Notes (Signed)
Patient rested quietly today with visitors at bedside. No complaints today. Fluids restarted per Dr. Imogene Burn. Potential discharge tomorrow. Refused to work with PT per their note in [facility].

## 2015-09-07 NOTE — [place] FL2 (Addendum)
[place] MEDICAID FL2 LEVEL OF CARE SCREENING TOOL     IDENTIFICATION  Patient Name: Brandy Schroeder Birthdate: February 13, 1919 Sex: female Admission Date (Current Location): 09/05/2015  [place] and [place] Number:  Chiropodist and Address:  [facility], [address], [place], [place] [address]      Provider Number: [id number]  Attending Physician Name and Address:  Shaune Pollack, MD  Relative Name and Phone Number:       Current Level of Care: Hospital Recommended Level of Care: Skilled Nursing Facility Prior Approval Number:    Date Approved/Denied:   PASRR Number:   [id number]  Discharge Plan: SNF    Current Diagnoses: Patient Active Problem List   Diagnosis Date Noted  . Multiple falls 09/05/2015  . Paroxysmal a-fib (HCC) 09/05/2015  . Acute on chronic renal failure (HCC) 09/05/2015  . Rhabdomyolysis 09/05/2015  . Anxiety 11/19/2014  . 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 Black stool 11/19/2014  . Degeneration of lumbar or lumbosacral intervertebral disc 11/19/2014  . Clinical depression 11/19/2014  . H/O: osteoarthritis 11/19/2014  . Adult hypothyroidism 11/19/2014  . Left leg pain 11/19/2014  . Does not feel right 11/19/2014  . Primary localized osteoarthrosis, lower leg 11/19/2014  . Edema, peripheral 11/19/2014  . CKD (chronic kidney disease), stage IV (HCC) 11/19/2014  . Acquired spondylolisthesis 11/19/2014  . Dermatitis, stasis 11/19/2014    Orientation RESPIRATION BLADDER Height & Weight     Self, Time, Situation, Place  Normal Incontinent Weight: 161 lb (73.029 kg) Height:   (165.1 cm)  BEHAVIORAL SYMPTOMS/MOOD NEUROLOGICAL BOWEL NUTRITION STATUS   (None )   Continent Diet (Heart )  AMBULATORY STATUS COMMUNICATION OF NEEDS Skin   Extensive Assist Verbally Normal                       Personal Care Assistance Level of Assistance  Bathing, Feeding, Dressing Bathing Assistance: Limited assistance Feeding  assistance: Independent Dressing Assistance: Limited assistance     Functional Limitations Info  Sight, Hearing, Speech Sight Info: Impaired (Patient is blind in her left eye.) Hearing Info: Impaired (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 ) Speech Info: Adequate    SPECIAL CARE FACTORS FREQUENCY  PT (By licensed PT)     PT Frequency:  (5)              Contractures      Additional Factors Info  Allergies, Code Status Code Status Info:  (DNR ) Allergies Info:  (Sulfa Antibiotics)           Current Medications (09/07/2015):  This is the current hospital active medication list Current Facility-Administered Medications  Medication Dose Route Frequency Provider Last Rate Last Dose  . 0.9 %  sodium chloride infusion   Intravenous Continuous Shaune Pollack, MD 50 mL/hr at 09/07/15 0902    . acetaminophen (TYLENOL) tablet 650 mg  650 mg Oral Q6H PRN Oralia Manis, MD   650 mg at 09/06/15 1532   Or  . acetaminophen (TYLENOL) suppository 650 mg  650 mg Rectal Q6H PRN Oralia Manis, MD      . ALPRAZolam Prudy Feeler) tablet 0.25-0.5 mg  0.25-0.5 mg Oral BID PRN Oralia Manis, MD      . heparin injection 5,000 Units  5,000 Units Subcutaneous 3 times per day Oralia Manis, MD   5,000 Units at 09/07/15 0557  . ondansetron (ZOFRAN) tablet 4 mg  4 mg Oral Q6H PRN Oralia Manis, MD  Or  . ondansetron (ZOFRAN) injection 4 mg  4 mg Intravenous Q6H PRN Oralia Manis, MD      . sodium chloride flush (NS) 0.9 % injection 3 mL  3 mL Intravenous Q12H Oralia Manis, MD   3 mL at 09/07/15 0902  . traZODone (DESYREL) tablet 150 mg  150 mg Oral Daily Oralia Manis, MD   150 mg at 09/07/15 0902     Discharge Medications: Please see discharge summary for a list of discharge medications.  Relevant Imaging Results:  Relevant Lab Results:   Additional Information  (SSN [id number])  Verta Ellen Sunkins, LCSW

## 2015-09-07 NOTE — Progress Notes (Signed)
[facility] Physicians - [place] at [facility]   PATIENT NAME: Tereza Gilham    MR#:  [id number]  DATE OF BIRTH:  November 16, 1918  SUBJECTIVE:  CHIEF COMPLAINT:   Chief Complaint  Patient presents with  . Fall  . Diarrhea    Generalized weakness. But better. REVIEW OF SYSTEMS:  CONSTITUTIONAL: No fever, has generalized weakness.  EYES: No blurred or double vision.  EARS, NOSE, AND THROAT: No tinnitus or ear pain.  RESPIRATORY: No cough, shortness of breath, wheezing or hemoptysis.  CARDIOVASCULAR: No chest pain, orthopnea, edema.  GASTROINTESTINAL: No nausea, vomiting, or abdominal pain. But has diarrhea.  GENITOURINARY: No dysuria, hematuria.  ENDOCRINE: No polyuria, nocturia,  HEMATOLOGY: No anemia, easy bruising or bleeding SKIN: No rash or lesion. MUSCULOSKELETAL: No joint pain or arthritis.   NEUROLOGIC: No tingling, numbness, weakness.  PSYCHIATRY: No anxiety or depression.   DRUG ALLERGIES:   Allergies  Allergen Reactions  . Sulfa Antibiotics     VITALS:  Blood pressure 164/58, pulse 73, temperature 97.9 F (36.6 C), temperature source Oral, resp. rate 18, height  (1.651 m), weight 73.029 kg (161 lb), SpO2 96 %.  PHYSICAL EXAMINATION:  GENERAL:  82 y.o.-year-old patient lying in the bed with no acute distress.  EYES: Pupils equal, round, reactive to light and accommodation. No scleral icterus. Extraocular muscles intact.  HEENT: Head atraumatic, normocephalic. Oropharynx and nasopharynx clear. Dry oral mucosa. NECK:  Supple, no jugular venous distention. No thyroid enlargement, no tenderness.  LUNGS: Normal breath sounds bilaterally, no wheezing, rales,rhonchi or crepitation. No use of accessory muscles of respiration.  CARDIOVASCULAR: S1, S2 normal. No murmurs, rubs, or gallops.  ABDOMEN: Soft, nontender, nondistended. Bowel sounds present. No organomegaly or mass.  EXTREMITIES: No pedal edema, cyanosis, or clubbing.  NEUROLOGIC: Cranial  nerves II through XII are intact. Muscle strength 5/5 in all extremities. Sensation intact. Gait not checked.  PSYCHIATRIC: The patient is alert and oriented x 3.  SKIN: No obvious rash, lesion, or ulcer.    LABORATORY PANEL:   CBC  Recent Labs Lab 09/06/15 0601  WBC 4.3  HGB 12.0  HCT 35.3  PLT 140*   ------------------------------------------------------------------------------------------------------------------  Chemistries   Recent Labs Lab 09/05/15 1807  09/07/15 0432  NA 133*  < > 139  K 3.4*  < > 3.7  CL 101  < > 110  CO2 20*  < > 23  GLUCOSE 143*  < > 103*  BUN 32*  < > 26*  CREATININE 1.61*  < > 1.17*  CALCIUM 8.3*  < > 8.2*  MG  --   --  2.0  AST 118*  --   --   ALT 26  --   --   ALKPHOS 45  --   --   BILITOT 0.9  --   --   < > = values in this interval not displayed. ------------------------------------------------------------------------------------------------------------------  Cardiac Enzymes No results for input(s): TROPONINI in the last 168 hours. ------------------------------------------------------------------------------------------------------------------  RADIOLOGY:  No results found.  EKG:   Orders placed or performed during the hospital encounter of 09/05/15  . ED EKG  . ED EKG  . EKG 12-Lead  . EKG 12-Lead    ASSESSMENT AND PLAN:   Multiple falls - unclear initial etiology. Possible due to dehydration and weakness. Per PT evaluation, SNF.  Rhabdomyolysis due to fall. CK is better,  continue fluids and follow-up CPK and renal function.  Acute on chronic renal failure. - likely secondary to dehydration or rhabdo,  avoid nephrotoxins, improved with IVF.  Paroxysmal a-fib. Now in sinus rhythm. No prior diagnosis of the same. Echocardiogram: Systolic function was normal. The estimated ejection fraction was in the range of 55% to 65%..  Anxiety - continue home meds Clinical depression - continue home meds  Adult  hypothyroidism - continue home meds   All the records are reviewed and case discussed with Care Management/Social Workerr. Management plans discussed with the patient, her son and sister and they are in agreement.  CODE STATUS: DO NOT RESUSCITATE  TOTAL TIME TAKING CARE OF THIS PATIENT: 33 minutes.  Greater than 50% time was spent on coordination of care and face-to-face counseling.  POSSIBLE D/C IN 1-2 DAYS, DEPENDING ON CLINICAL CONDITION.   Shaune Pollack M.D on 09/07/2015 at 1:23 PM  Between 7am to 6pm - Pager - [phone]  After 6pm go to [email or web address] - password [email or web address] [facility]  [place] Hospitalists  Office  [phone]  CC: Primary care physician; Dortha Kern, PA

## 2015-09-07 NOTE — Clinical Social Work Placement (Signed)
   CLINICAL SOCIAL WORK PLACEMENT  NOTE  Date:  09/07/2015  Patient Details  Name: Brandy Schroeder MRN: [id number] Date of Birth: [birth year]  Clinical Social Work is seeking post-discharge placement for this patient at the Skilled  Nursing Facility level of care (*CSW will initial, date and re-position this form in  chart as items are completed):  No   Patient/family provided with [facility] Clinical Social Work Department's list of facilities offering this level of care within the geographic area requested by the patient (or if unable, by the patient's family).  Yes   Patient/family informed of their freedom to choose among providers that offer the needed level of care, that participate in Medicare, Medicaid or managed care program needed by the patient, have an available bed and are willing to accept the patient.  Yes   Patient/family informed of [facility]'s ownership interest in [facility] and [facility], as well as of the fact that they are under no obligation to receive care at these facilities.  PASRR submitted to EDS on 09/07/15     PASRR number received on       Existing PASRR number confirmed on       FL2 transmitted to all facilities in geographic area requested by pt/family on 09/07/15     FL2 transmitted to all facilities within larger geographic area on       Patient informed that his/her managed care company has contracts with or will negotiate with certain facilities, including the following:            Patient/family informed of bed offers received.  Patient chooses bed at       Physician recommends and patient chooses bed at      Patient to be transferred to   on  .  Patient to be transferred to facility by       Patient family notified on   of transfer.  Name of family member notified:        PHYSICIAN       Additional Comment:    _______________________________________________ Idamae Lusher, LCSW 09/07/2015, 12:08 PM

## 2015-09-07 NOTE — Care Management Important Message (Signed)
Important Message  Patient Details  Name: Brandy Schroeder MRN: [id number] Date of Birth: November 03, 1918   Medicare Important Message Given:  Yes    Olegario Messier A Amy Gothard 09/07/2015, 2:59 PM

## 2015-09-07 NOTE — Progress Notes (Signed)
PT Cancellation Note  Patient Details Name: ALLYCE BOCHICCHIO MRN: [id number] DOB: 20-Jun-1919   Cancelled Treatment:    Reason Eval/Treat Not Completed: Patient declined, no reason specified. Patient had just started eating dinner when PT entered the room, she declined to attempt any mobility at this time. PT will continue to follow and re-attempt when patient is available and able to participate in therapy.   Kerin Ransom, PT, DPT    09/07/2015, 5:48 PM

## 2015-09-07 NOTE — Care Management (Signed)
Patient presents from [facility].  Prior to this admission, she lived alone and able to perform her adls independently.  AT present, will be pursuing skilled nursing at discharge.

## 2015-09-08 DIAGNOSIS — H5442 Blindness, left eye, normal vision right eye: Secondary | ICD-10-CM | POA: Diagnosis not present

## 2015-09-08 DIAGNOSIS — I350 Nonrheumatic aortic (valve) stenosis: Secondary | ICD-10-CM | POA: Diagnosis not present

## 2015-09-08 DIAGNOSIS — R600 Localized edema: Secondary | ICD-10-CM | POA: Diagnosis not present

## 2015-09-08 DIAGNOSIS — I4891 Unspecified atrial fibrillation: Secondary | ICD-10-CM | POA: Diagnosis not present

## 2015-09-08 DIAGNOSIS — F419 Anxiety disorder, unspecified: Secondary | ICD-10-CM | POA: Diagnosis not present

## 2015-09-08 DIAGNOSIS — S50821A Blister (nonthermal) of right forearm, initial encounter: Secondary | ICD-10-CM | POA: Diagnosis not present

## 2015-09-08 DIAGNOSIS — M6281 Muscle weakness (generalized): Secondary | ICD-10-CM | POA: Diagnosis not present

## 2015-09-08 DIAGNOSIS — I480 Paroxysmal atrial fibrillation: Secondary | ICD-10-CM | POA: Diagnosis not present

## 2015-09-08 DIAGNOSIS — I129 Hypertensive chronic kidney disease with stage 1 through stage 4 chronic kidney disease, or unspecified chronic kidney disease: Secondary | ICD-10-CM | POA: Diagnosis not present

## 2015-09-08 DIAGNOSIS — T796XXA Traumatic ischemia of muscle, initial encounter: Secondary | ICD-10-CM | POA: Diagnosis not present

## 2015-09-08 DIAGNOSIS — M6282 Rhabdomyolysis: Secondary | ICD-10-CM | POA: Diagnosis not present

## 2015-09-08 DIAGNOSIS — Z9181 History of falling: Secondary | ICD-10-CM | POA: Diagnosis not present

## 2015-09-08 DIAGNOSIS — N179 Acute kidney failure, unspecified: Secondary | ICD-10-CM | POA: Diagnosis not present

## 2015-09-08 DIAGNOSIS — R05 Cough: Secondary | ICD-10-CM | POA: Diagnosis not present

## 2015-09-08 DIAGNOSIS — N183 Chronic kidney disease, stage 3 (moderate): Secondary | ICD-10-CM | POA: Diagnosis not present

## 2015-09-08 DIAGNOSIS — R296 Repeated falls: Secondary | ICD-10-CM | POA: Diagnosis not present

## 2015-09-08 LAB — CK: CK TOTAL: 2409 U/L — AB (ref 38–234)

## 2015-09-08 MED ORDER — ALPRAZOLAM 0.25 MG PO TABS: 0.2500 mg | ORAL_TABLET | Freq: Two times a day (BID) | ORAL | Status: DC | PRN

## 2015-09-08 MED ORDER — AMLODIPINE BESYLATE 10 MG PO TABS
10.0000 mg | ORAL_TABLET | Freq: Every day | ORAL | Status: DC
  Administered 2015-09-08: 10 mg via ORAL
  Filled 2015-09-08: qty 1

## 2015-09-08 MED ORDER — AMLODIPINE BESYLATE 10 MG PO TABS: 10.0000 mg | ORAL_TABLET | Freq: Every day | ORAL | Status: DC

## 2015-09-08 NOTE — Discharge Instructions (Signed)
Heart healthy diet. °Activity as tolerated. °Fall and aspiration precaution. °

## 2015-09-08 NOTE — Progress Notes (Signed)
Discharge packet prepared by Trula Ore, CSW. Report called to Surveyor, quantity at [facility] EMS called for transport. Family at bedside, updated. No complaints or questions at this time. Will remove IV and tele when EMS arrives.

## 2015-09-08 NOTE — Progress Notes (Signed)
CSW received patient's PASRR [id number] A. FL2 updated.   Woodroe Mode, MSW, LCSW-A Clinical Social Work Department [phone]

## 2015-09-08 NOTE — Progress Notes (Signed)
Clinical Social Worker was informe that patient will be medically ready to discharge to [facility]. Patient and her family  in a agreement with plan. CSW called Sue Lush- admission coordinator to confirm that patient's bed is ready. Provided patient's room number 322 and number to call for report [phone]. All discharge information faxed to [facility] via [place]. Rx's and DNR added to discharge packet.    RN will call report and patient will discharge to [facility] via [facility]  Woodroe Mode, MSW, LCSW-A Clinical Social Work Department [phone]

## 2015-09-08 NOTE — Discharge Summary (Signed)
[facility] - [place] at [facility]   PATIENT NAME: Brandy Schroeder    MR#:  [id number]  DATE OF BIRTH:  1919-05-13  DATE OF ADMISSION:  09/05/2015 ADMITTING PHYSICIAN: Oralia Manis, MD  DATE OF DISCHARGE:  09/08/2015 PRIMARY CARE PHYSICIAN: Dortha Kern, PA    ADMISSION DIAGNOSIS:  Generalized weakness [R53.1] Multiple falls [R29.6] Diarrhea, unspecified type [R19.7]   DISCHARGE DIAGNOSIS:   Multiple falls Rhabdomyolysis due to fall SECONDARY DIAGNOSIS:   Past Medical History  Diagnosis Date  . Anxiety   . Blind left eye   . Depression   . Hypothyroidism   . OA (osteoarthritis)   . CKD (chronic kidney disease)     HOSPITAL COURSE:  Multiple falls - unclear initial etiology. Possible due to dehydration and weakness. Per PT evaluation, SNF.  Rhabdomyolysis due to fall. Improving with  fluids and follow-up CPK and renal function as outpatient.  Acute on chronic renal failure. - likely secondary to dehydration or rhabdo, avoid nephrotoxins, improved with IVF.  Paroxysmal a-fib. Now in sinus rhythm. No prior diagnosis of the same. Echocardiogram: Systolic function was normal. The estimated ejection fraction was in the range of 55% to 65%.. She is not a candidate for anticoagulation due to multiple falls.  Anxiety - continue home meds Clinical depression - continue home meds Adult hypothyroidism - continue home meds  HTN, hold lasix, give norvasc. DISCHARGE CONDITIONS:   Stable, discharge to SNF today.  CONSULTS OBTAINED:     DRUG ALLERGIES:   Allergies  Allergen Reactions  . Sulfa Antibiotics     DISCHARGE MEDICATIONS:   Current Discharge Medication List    START taking these medications   Details  amLODipine (NORVASC) 10 MG tablet Take 1 tablet (10 mg total) by mouth daily. Qty: 30 tablet, Refills: 1      CONTINUE these medications which have CHANGED   Details  ALPRAZolam (XANAX) 0.25 MG tablet Take 1 tablet (0.25 mg  total) by mouth 2 (two) times daily as needed for anxiety. Qty: 20 tablet, Refills: 0      CONTINUE these medications which have NOT CHANGED   Details  traZODone (DESYREL) 150 MG tablet TAKE ONE TABLET BY MOUTH ONCE DAILY Qty: 30 tablet, Refills: 3    Alpha Lipoic Acid 200 MG CAPS Take 1 capsule by mouth daily.    Cholecalciferol (VITAMIN D3) 2000 UNITS TABS Take 1 tablet by mouth daily.      STOP taking these medications     furosemide (LASIX) 40 MG tablet      HYDROcodone-acetaminophen (NORCO/VICODIN) 5-325 MG tablet      KLOR-CON M20 20 MEQ tablet      doxycycline (VIBRA-TABS) 100 MG tablet          DISCHARGE INSTRUCTIONS:   If you experience worsening of your admission symptoms, develop shortness of breath, life threatening emergency, suicidal or homicidal thoughts you must seek medical attention immediately by calling 911 or calling your MD immediately  if symptoms less severe.  You Must read complete instructions/literature along with all the possible adverse reactions/side effects for all the Medicines you take and that have been prescribed to you. Take any new Medicines after you have completely understood and accept all the possible adverse reactions/side effects.   Please note  You were cared for by a hospitalist during your hospital stay. If you have any questions about your discharge medications or the care you received while you were in the hospital after you are discharged, you can  call the unit and asked to speak with the hospitalist on call if the hospitalist that took care of you is not available. Once you are discharged, your primary care physician will handle any further medical issues. Please note that NO REFILLS for any discharge medications will be authorized once you are discharged, as it is imperative that you return to your primary care physician (or establish a relationship with a primary care physician if you do not have one) for your aftercare needs so  that they can reassess your need for medications and monitor your lab values.    Today   SUBJECTIVE   Weakness.   VITAL SIGNS:  Blood pressure 176/71, pulse 80, temperature 97.8 F (36.6 C), temperature source Oral, resp. rate 18, height 5\' 5"  (1.651 m), weight 73.029 kg (161 lb), SpO2 95 %.  I/O:   Intake/Output Summary (Last 24 hours) at 09/08/15 1250 Last data filed at 09/08/15 1045  Gross per 24 hour  Intake 1464.17 ml  Output      0 ml  Net 1464.17 ml    PHYSICAL EXAMINATION:  GENERAL:  82 y.o.-year-old patient lying in the bed with no acute distress.  EYES: Pupils equal, round, reactive to light and accommodation. No scleral icterus. Extraocular muscles intact.  HEENT: Head atraumatic, normocephalic. Oropharynx and nasopharynx clear.  NECK:  Supple, no jugular venous distention. No thyroid enlargement, no tenderness.  LUNGS: Normal breath sounds bilaterally, no wheezing, rales,rhonchi or crepitation. No use of accessory muscles of respiration.  CARDIOVASCULAR: S1, S2 normal. No murmurs, rubs, or gallops.  ABDOMEN: Soft, non-tender, non-distended. Bowel sounds present. No organomegaly or mass.  EXTREMITIES: No pedal edema, cyanosis, or clubbing.  NEUROLOGIC: Cranial nerves II through XII are intact. Muscle strength 4/5 in all extremities. Sensation intact. Gait not checked.  PSYCHIATRIC: The patient is alert and oriented x 3.  SKIN: No obvious rash, lesion, or ulcer.   DATA REVIEW:   CBC  Recent Labs Lab 09/06/15 0601  WBC 4.3  HGB 12.0  HCT 35.3  PLT 140*    Chemistries   Recent Labs Lab 09/05/15 1807  09/07/15 0432  NA 133*  < > 139  K 3.4*  < > 3.7  CL 101  < > 110  CO2 20*  < > 23  GLUCOSE 143*  < > 103*  BUN 32*  < > 26*  CREATININE 1.61*  < > 1.17*  CALCIUM 8.3*  < > 8.2*  MG  --   --  2.0  AST 118*  --   --   ALT 26  --   --   ALKPHOS 45  --   --   BILITOT 0.9  --   --   < > = values in this interval not displayed.  Cardiac  Enzymes No results for input(s): TROPONINI in the last 168 hours.  Microbiology Results  Results for orders placed or performed during the hospital encounter of 09/05/15  C difficile quick scan w PCR reflex     Status: None   Collection Time: 09/05/15  6:07 PM  Result Value Ref Range Status   C Diff antigen NEGATIVE NEGATIVE Final   C Diff toxin NEGATIVE NEGATIVE Final   C Diff interpretation Negative for C. difficile  Final  Gastrointestinal Panel by PCR , Stool     Status: None   Collection Time: 09/05/15  9:09 PM  Result Value Ref Range Status   Campylobacter species NOT DETECTED NOT DETECTED Final   Plesimonas  shigelloides NOT DETECTED NOT DETECTED Final   Salmonella species NOT DETECTED NOT DETECTED Final   Yersinia enterocolitica NOT DETECTED NOT DETECTED Final   Vibrio species NOT DETECTED NOT DETECTED Final   Vibrio cholerae NOT DETECTED NOT DETECTED Final   Enteroaggregative E coli (EAEC) NOT DETECTED NOT DETECTED Final   Enteropathogenic E coli (EPEC) NOT DETECTED NOT DETECTED Final   Enterotoxigenic E coli (ETEC) NOT DETECTED NOT DETECTED Final   Shiga like toxin producing E coli (STEC) NOT DETECTED NOT DETECTED Final   E. coli O157 NOT DETECTED NOT DETECTED Final   Shigella/Enteroinvasive E coli (EIEC) NOT DETECTED NOT DETECTED Final   Cryptosporidium NOT DETECTED NOT DETECTED Final   Cyclospora cayetanensis NOT DETECTED NOT DETECTED Final   Entamoeba histolytica NOT DETECTED NOT DETECTED Final   Giardia lamblia NOT DETECTED NOT DETECTED Final   Adenovirus F40/41 NOT DETECTED NOT DETECTED Final   Astrovirus NOT DETECTED NOT DETECTED Final   Norovirus GI/GII NOT DETECTED NOT DETECTED Final   Rotavirus A NOT DETECTED NOT DETECTED Final   Sapovirus (I, II, IV, and V) NOT DETECTED NOT DETECTED Final  MRSA PCR Screening     Status: None   Collection Time: 09/06/15  1:15 AM  Result Value Ref Range Status   MRSA by PCR NEGATIVE NEGATIVE Final    Comment:        The  GeneXpert MRSA Assay (FDA approved for NASAL specimens only), is one component of a comprehensive MRSA colonization surveillance program. It is not intended to diagnose MRSA infection nor to guide or monitor treatment for MRSA infections.     RADIOLOGY:  No results found.      Management plans discussed with the patient, her sister and they are in agreement.  CODE STATUS:     Code Status Orders        Start     Ordered   09/06/15 0034  Do not attempt resuscitation (DNR)   Continuous    Question Answer Comment  In the event of cardiac or respiratory ARREST Do not call a "code blue"   In the event of cardiac or respiratory ARREST Do not perform Intubation, CPR, defibrillation or ACLS   In the event of cardiac or respiratory ARREST Use medication by any route, position, wound care, and other measures to relive pain and suffering. May use oxygen, suction and manual treatment of airway obstruction as needed for comfort.      09/06/15 0033    Code Status History    Date Active Date Inactive Code Status Order ID Comments User Context   This patient has a current code status but no historical code status.    Advance Directive Documentation        Most Recent Value   Type of Advance Directive  -- [Ava Oakley]   Pre-existing out of facility DNR order (yellow form or pink MOST form)     "MOST" Form in Place?        TOTAL TIME TAKING CARE OF THIS PATIENT: 37 minutes.    Shaune Pollack M.D on 09/08/2015 at 12:50 PM  Between 7am to 6pm - Pager - [phone]  After 6pm go to [email or web address] - password [email or web address] [facility]  [facility]  Office  [phone]  CC: Primary care physician; Dortha Kern, PA

## 2015-09-10 ENCOUNTER — Telehealth: Payer: Self-pay | Admitting: Family Medicine

## 2015-09-10 DIAGNOSIS — M6282 Rhabdomyolysis: Secondary | ICD-10-CM | POA: Diagnosis not present

## 2015-09-10 DIAGNOSIS — I480 Paroxysmal atrial fibrillation: Secondary | ICD-10-CM | POA: Diagnosis not present

## 2015-09-10 DIAGNOSIS — F39 Unspecified mood [affective] disorder: Secondary | ICD-10-CM

## 2015-09-10 DIAGNOSIS — I350 Nonrheumatic aortic (valve) stenosis: Secondary | ICD-10-CM

## 2015-09-10 DIAGNOSIS — N183 Chronic kidney disease, stage 3 (moderate): Secondary | ICD-10-CM | POA: Diagnosis not present

## 2015-09-10 DIAGNOSIS — R600 Localized edema: Secondary | ICD-10-CM | POA: Diagnosis not present

## 2015-09-10 NOTE — Telephone Encounter (Signed)
No documentation of any pneumonia vaccine in Allscripts, EPIC, nor NCIR. Deana at [facility] advised.

## 2015-09-10 NOTE — Telephone Encounter (Signed)
Deana with [facility] stated their on site physician Dr. Tillman Abide would like to know if pt has received the Prevnar Pneumonia vaccine and if so, would like the documentation faxed to [facility]. Fax# [phone]. Thanks TNP

## 2015-09-15 ENCOUNTER — Telehealth: Payer: Self-pay | Admitting: Family Medicine

## 2015-09-15 NOTE — Telephone Encounter (Signed)
FYI... Brandy Schroeder fell was in [facility].  Sent to [facility] for rehab.  Just wanted to let you know.  Thanks [employer]

## 2015-09-24 DIAGNOSIS — I350 Nonrheumatic aortic (valve) stenosis: Secondary | ICD-10-CM | POA: Diagnosis not present

## 2015-09-24 DIAGNOSIS — M6282 Rhabdomyolysis: Secondary | ICD-10-CM | POA: Diagnosis not present

## 2015-09-24 DIAGNOSIS — N183 Chronic kidney disease, stage 3 (moderate): Secondary | ICD-10-CM | POA: Diagnosis not present

## 2015-09-24 DIAGNOSIS — F39 Unspecified mood [affective] disorder: Secondary | ICD-10-CM

## 2015-09-24 DIAGNOSIS — I480 Paroxysmal atrial fibrillation: Secondary | ICD-10-CM | POA: Diagnosis not present

## 2015-09-28 DIAGNOSIS — S50821A Blister (nonthermal) of right forearm, initial encounter: Secondary | ICD-10-CM

## 2015-10-08 ENCOUNTER — Ambulatory Visit (INDEPENDENT_AMBULATORY_CARE_PROVIDER_SITE_OTHER): Payer: Medicare Other | Admitting: Family Medicine

## 2015-10-08 ENCOUNTER — Encounter: Payer: Self-pay | Admitting: Family Medicine

## 2015-10-08 VITALS — BP 130/78 | HR 102 | Temp 97.9°F | Resp 16 | Wt 158.6 lb

## 2015-10-08 DIAGNOSIS — Z111 Encounter for screening for respiratory tuberculosis: Secondary | ICD-10-CM | POA: Diagnosis not present

## 2015-10-08 DIAGNOSIS — M5137 Other intervertebral disc degeneration, lumbosacral region: Secondary | ICD-10-CM

## 2015-10-08 DIAGNOSIS — E039 Hypothyroidism, unspecified: Secondary | ICD-10-CM

## 2015-10-08 DIAGNOSIS — N184 Chronic kidney disease, stage 4 (severe): Secondary | ICD-10-CM | POA: Diagnosis not present

## 2015-10-08 DIAGNOSIS — I480 Paroxysmal atrial fibrillation: Secondary | ICD-10-CM

## 2015-10-08 DIAGNOSIS — R296 Repeated falls: Secondary | ICD-10-CM

## 2015-10-08 DIAGNOSIS — T796XXD Traumatic ischemia of muscle, subsequent encounter: Secondary | ICD-10-CM | POA: Diagnosis not present

## 2015-10-08 DIAGNOSIS — F419 Anxiety disorder, unspecified: Secondary | ICD-10-CM | POA: Diagnosis not present

## 2015-10-08 NOTE — Progress Notes (Signed)
Patient ID: Brandy Schroeder, female   DOB: 09/25/1918, 82 y.o.   MRN: [id number]   Patient: Brandy Schroeder Female    DOB: 02/22/1919   82 y.o.   MRN: [id number] Visit Date: 10/08/2015  Today's Provider: Dortha Kernennis Chrismon, PA   Chief Complaint  Patient presents with  . Follow-up   Subjective:    HPI Patient presents today for completion of FL2 paperwork. [facility]. Family is wanting [facility] to [facility] so she can have more individualized care and be closer to family. [facility] and due to being in the assisted living department staff was unaware of the fall for several hours. Patient was then hospitalized on 09-05-15 for weakness, rhabdomyolysis due to the fall with muscle injuries, discharged 09-08-15 and transferred [facility]. [facility]   Past Medical History  Diagnosis Date  . Anxiety   . Blind left eye   . Depression   . Hypothyroidism   . OA (osteoarthritis)   . CKD (chronic kidney disease)    Patient Active Problem List   Diagnosis Date Noted  . Multiple falls 09/05/2015  . Paroxysmal a-fib (HCC) 09/05/2015  . Acute on chronic renal failure (HCC) 09/05/2015  . Rhabdomyolysis 09/05/2015  . Anxiety 11/19/2014  . 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 Black stool 11/19/2014  . Degeneration of lumbar or lumbosacral intervertebral disc 11/19/2014  . Clinical depression 11/19/2014  . H/O: osteoarthritis 11/19/2014  . Adult hypothyroidism 11/19/2014  . Left leg pain 11/19/2014  . Does not feel right 11/19/2014  . Primary localized osteoarthrosis, lower leg 11/19/2014  . Edema, peripheral 11/19/2014  . CKD (chronic kidney disease), stage IV (HCC) 11/19/2014  . Acquired spondylolisthesis 11/19/2014  . Dermatitis, stasis 11/19/2014   Past Surgical History  Procedure Laterality Date  . Eye surgery    . Abdominal hysterectomy    .  Cholecystectomy     Family History  Problem Relation Age of Onset  . Arthritis Mother   . Stroke Mother   . Heart disease Brother   . Arthritis Brother    Previous Medications   ALPHA LIPOIC ACID 200 MG CAPS    Take 1 capsule by mouth daily.   ALPRAZOLAM (XANAX) 0.25 MG TABLET    Take 1 tablet by mouth twice a day as needed for anxiety   AMLODIPINE (NORVASC) 10 MG TABLET    Take 1 tablet (10 mg total) by mouth daily.   CHOLECALCIFEROL (VITAMIN D3) 2000 UNITS TABS    Take 1 tablet by mouth daily.   TRAZODONE (DESYREL) 150 MG TABLET    TAKE ONE TABLET BY MOUTH ONCE DAILY   Allergies  Allergen Reactions  . Sulfa Antibiotics     Review of Systems  Constitutional: Negative.   HENT: Negative.   Eyes: Negative.   Respiratory: Negative.   Cardiovascular: Negative.   Gastrointestinal: Negative.   Endocrine: Negative.   Genitourinary: Negative.   Musculoskeletal: Negative.   Skin: Negative.   Allergic/Immunologic: Negative.   Neurological: Negative.   Hematological: Negative.   Psychiatric/Behavioral: Negative.     Social History  Substance Use Topics  . Smoking status: Never Smoker   . Smokeless tobacco: Not on file  . Alcohol Use: No   Objective:   BP 130/78 mmHg  Pulse 102  Temp(Src) 97.9 F (36.6 C) (Oral)  Resp 16  Wt 158 lb 9.6 oz (71.94  kg)  SpO2 95% Wt Readings from Last 3 Encounters:  10/08/15 158 lb 9.6 oz (71.94 kg)  09/05/15 161 lb (73.029 kg)  06/11/15 161 lb 12.8 oz (73.392 kg)   Temp Readings from Last 3 Encounters:  10/08/15 97.9 F (36.6 C) Oral  09/08/15 97.8 F (36.6 C)   06/11/15 97.6 F (36.4 C) Oral   BP Readings from Last 3 Encounters:  10/08/15 130/78  09/08/15 146/53  06/11/15 150/72   Pulse Readings from Last 3 Encounters:  10/08/15 102  09/08/15 73  06/11/15 83    Physical Exam  Constitutional: She is oriented to person, place, and time. She appears well-developed and well-nourished.  HENT:  Head: Normocephalic.  Right  Ear: External ear normal.  Left Ear: External ear normal.  Mouth/Throat: Oropharynx is clear and moist.  Some impairment of hearing.  Eyes: Conjunctivae and EOM are normal.  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  Neck: Normal range of motion. Neck supple. No thyromegaly present.  Cardiovascular: Normal rate, regular rhythm and normal heart sounds.   Pulmonary/Chest: Effort normal and breath sounds normal.  Abdominal: Soft. Bowel sounds are normal.  Musculoskeletal: She exhibits no edema.  History of DJD left hip with past fracture and left leg shorter than right by 2". Unable to cross left leg over the right. Pulses are symmetric. No significant edema of LE.   Lymphadenopathy:    She has no cervical adenopathy.  Neurological: She is alert and oriented to person, place, and time.  Skin: Skin is warm and dry.  Psychiatric: She has a normal mood and affect. Her behavior is normal.      Assessment & Plan:     1. Traumatic rhabdomyolysis, subsequent encounter Onset secondary to falls and diarrhea prior to hospitalization on 09-05-15. CK level spiked at 5360 on 09-06-15 and was down to 2409 after IV hydration on 09-08-15. Will recheck level and check renal function. - CK (Creatine Kinase)  2. Multiple falls Onset prior to hospitalization on 09-05-15 secondary to generalized weakness and diarrhea. No further diarrhea episodes and eating back to usual. No further falls. 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 Recommend she continue using walker with any ambulation. Recheck labs and follow up pending reports. FL-2 form completed for transfer to [facility] facility which is closer to her family and has a lower ratio of caregivers to clients.  3. CKD (chronic kidney disease), stage IV (HCC) Acute on chronic renal failure secondary to dehydration or rhabdomyolysis. Diuretics were  discontinued and improved with IV hydration. Will recheck labs. - CBC with Differential/Platelet - Comprehensive metabolic panel  4. Degeneration of lumbar or lumbosacral intervertebral disc Intermittent pain in lower back and left leg with history of degenerative disease/arthritis. May continue Tylenol Extra Strength. Norco was discontinued after hospitalization for falls.  5. Hypothyroidism, unspecified hypothyroidism type No supplementation with Levothyroxine since hospitalization. Will recheck TSH and monitor. - TSH  6. Paroxysmal a-fib (HCC) In sinus rhythm during hospitalization. Could not anticoagulate due to history of multiple falls.  7. Anxiety Stable and controlled with Trazodone 150 mg qd and Xanax 0.25 mg BID prn.  8. Screening-pulmonary TB Needs TB test prior to entrance to an assisted living facility. - Quantiferon tb gold assay

## 2015-10-12 DIAGNOSIS — E039 Hypothyroidism, unspecified: Secondary | ICD-10-CM | POA: Diagnosis not present

## 2015-10-12 DIAGNOSIS — N184 Chronic kidney disease, stage 4 (severe): Secondary | ICD-10-CM | POA: Diagnosis not present

## 2015-10-12 DIAGNOSIS — T796XXD Traumatic ischemia of muscle, subsequent encounter: Secondary | ICD-10-CM | POA: Diagnosis not present

## 2015-10-14 ENCOUNTER — Other Ambulatory Visit: Payer: Self-pay | Admitting: Family Medicine

## 2015-10-14 LAB — COMPREHENSIVE METABOLIC PANEL
ALK PHOS: 63 IU/L (ref 39–117)
ALT: 9 IU/L (ref 0–32)
AST: 20 IU/L (ref 0–40)
Albumin/Globulin Ratio: 1.7 (ref 1.2–2.2)
Albumin: 4.2 g/dL (ref 3.2–4.6)
BUN/Creatinine Ratio: 17 (ref 11–26)
BUN: 23 mg/dL (ref 10–36)
Bilirubin Total: 0.4 mg/dL (ref 0.0–1.2)
CO2: 21 mmol/L (ref 18–29)
CREATININE: 1.36 mg/dL — AB (ref 0.57–1.00)
Calcium: 9 mg/dL (ref 8.7–10.3)
Chloride: 99 mmol/L (ref 96–106)
GFR calc Af Amer: 38 mL/min/{1.73_m2} — ABNORMAL LOW (ref >59)
GFR calc non Af Amer: 33 mL/min/{1.73_m2} — ABNORMAL LOW (ref >59)
GLUCOSE: 116 mg/dL — AB (ref 65–99)
Globulin, Total: 2.5 g/dL (ref 1.5–4.5)
Potassium: 4.6 mmol/L (ref 3.5–5.2)
Sodium: 138 mmol/L (ref 134–144)
Total Protein: 6.7 g/dL (ref 6.0–8.5)

## 2015-10-14 LAB — CBC WITH DIFFERENTIAL/PLATELET
Basophils Absolute: 0 10*3/uL (ref 0.0–0.2)
Basos: 0 %
EOS (ABSOLUTE): 0.1 10*3/uL (ref 0.0–0.4)
EOS: 1 %
Hematocrit: 37.2 % (ref 34.0–46.6)
Hemoglobin: 12.3 g/dL (ref 11.1–15.9)
Immature Grans (Abs): 0 10*3/uL (ref 0.0–0.1)
Immature Granulocytes: 1 %
LYMPHS ABS: 1.1 10*3/uL (ref 0.7–3.1)
Lymphs: 17 %
MCH: 30 pg (ref 26.6–33.0)
MCHC: 33.1 g/dL (ref 31.5–35.7)
MCV: 91 fL (ref 79–97)
MONOCYTES: 8 %
MONOS ABS: 0.5 10*3/uL (ref 0.1–0.9)
NEUTROS ABS: 4.5 10*3/uL (ref 1.4–7.0)
Neutrophils: 73 %
Platelets: 253 10*3/uL (ref 150–379)
RBC: 4.1 x10E6/uL (ref 3.77–5.28)
RDW: 14.6 % (ref 12.3–15.4)
WBC: 6.2 10*3/uL (ref 3.4–10.8)

## 2015-10-14 LAB — QUANTIFERON IN TUBE
QUANTIFERON MITOGEN VALUE: 5.7 [IU]/mL
QUANTIFERON TB AG VALUE: 0.09 [IU]/mL
QUANTIFERON TB GOLD: NEGATIVE
Quantiferon Nil Value: 0.21 IU/mL

## 2015-10-14 LAB — QUANTIFERON TB GOLD ASSAY (BLOOD)

## 2015-10-14 LAB — TSH: TSH: 8.38 u[IU]/mL — AB (ref 0.450–4.500)

## 2015-10-14 LAB — CK: CK TOTAL: 96 U/L (ref 24–173)

## 2015-10-14 NOTE — Telephone Encounter (Signed)
Pt contacted office for refill request on the following medications:  Hydrocodone-Acetaminophen 5-325.  [phone]/WJCB#[phone]/MW

## 2015-10-16 NOTE — Telephone Encounter (Signed)
Please review-aa 

## 2015-10-16 NOTE — Telephone Encounter (Signed)
Pt called back about her pain medication.  Please call when ready.  thanksteri

## 2015-10-16 NOTE — Telephone Encounter (Signed)
Remind patient this pain pill was discontinued when she was in the hospital due to its potential to cause drowsiness and increase her risk for falling (especially when she is taking the Alprazolam, also). Recommend she use the Extra Strength Tylenol and Aleve BID for arthritic pains.

## 2015-10-19 ENCOUNTER — Telehealth: Payer: Self-pay

## 2015-10-19 NOTE — Telephone Encounter (Signed)
Patient advised as directed below. Patient verbalized understanding.  

## 2015-10-19 NOTE — Telephone Encounter (Signed)
Attempted to contact patient to advise her that [facility] are available for pick up at the front desk.

## 2015-10-19 NOTE — Telephone Encounter (Signed)
Left message for pt's niece Rivka BarbaraGlenda to call back.

## 2015-10-19 NOTE — Telephone Encounter (Signed)
-----   Message from Tamsen Roersennis E Chrismon, [place] sent at 10/19/2015  8:08 AM EDT ----- No anemia or sign of infections. Creatinine and GFR are stable but confirms chronic kidney disease still present. Thyroid screening test is high.(Ask lab to run T4 and Free T3 to see if she needs to get back on the Levothyroxine).

## 2015-10-20 LAB — T3, FREE: T3 FREE: 2.8 pg/mL (ref 2.0–4.4)

## 2015-10-20 LAB — SPECIMEN STATUS REPORT

## 2015-10-20 LAB — T4: T4 TOTAL: 7.4 ug/dL (ref 4.5–12.0)

## 2015-10-20 NOTE — Telephone Encounter (Signed)
-----   Message from Tamsen Roersennis E Chrismon, [place] sent at 10/19/2015  5:18 PM EDT ----- Normal T4 and Free T3 levels. Will allow to stay off the Levothyroxine and follow up blood levels in 6 months.

## 2015-10-20 NOTE — Telephone Encounter (Signed)
Patient's son in law advised as directed below when he came in to pick up FL2 forms for patient.

## 2015-10-21 ENCOUNTER — Telehealth: Payer: Self-pay

## 2015-10-21 NOTE — Telephone Encounter (Signed)
Patient's son in law advised on 10/20/2015 when he came in the office to pick up patient's FL2 paperwork.

## 2015-10-21 NOTE — Telephone Encounter (Signed)
-----   Message from Tamsen Roersennis E Chrismon, [place] sent at 10/20/2015  6:16 PM EDT ----- Notify patient of normal thyroid blood results and recheck levels in 6 months.

## 2015-10-22 ENCOUNTER — Telehealth: Payer: Self-pay | Admitting: Family Medicine

## 2015-10-22 NOTE — Telephone Encounter (Signed)
L/M stating below.  

## 2015-10-22 NOTE — Telephone Encounter (Signed)
Maurine Ministerennis, did you call patient's niece? Please advise. Thanks!

## 2015-10-22 NOTE — Telephone Encounter (Signed)
Rivka BarbaraGlenda pt niece is returning call.  [phone]/WJCB#[phone]/MW

## 2015-10-22 NOTE — Telephone Encounter (Signed)
Was trying to let her know Brandy Schroeder's thyroid test was normal and we needed to recheck blood levels in 6 months.

## 2015-11-09 ENCOUNTER — Telehealth: Payer: Self-pay | Admitting: Family Medicine

## 2015-11-09 NOTE — Telephone Encounter (Signed)
Had a negative test result on 10-12-15 for TB. Don't see the need for another test. Family should have the new nursing facility give us a call for us to fully explain the Quantiferon Gold test we did and how much more accurate and specific it is.

## 2015-11-09 NOTE — Telephone Encounter (Signed)
[facility] is [facility] and going to an more advance nursing home.  He states pt will need a TB test done again.  Please call when the lab slip is ready to be picked up.  CB#[phone]/MW

## 2015-11-10 ENCOUNTER — Telehealth: Payer: Self-pay | Admitting: Family Medicine

## 2015-11-10 NOTE — Telephone Encounter (Signed)
[facility] called saying they need a double Negative TB test for entrance to their facility.  Brandy Schroeder from [facility] called saying [facility] Schro[facility] called [facility] to schedule a TB test and that we needed a reason why it needs to be a double neg.  Thank sTeri

## 2015-11-10 NOTE — Telephone Encounter (Signed)
Pt's son in law called to make sure [facility] to advised us [facility] require a double negative TB test. Thanks TNP

## 2015-11-10 NOTE — Telephone Encounter (Signed)
Brandy Schroeder

## 2015-11-10 NOTE — Telephone Encounter (Signed)
Patient's son in law Brynda PeonJerry Oakley advised as directed below. [name] states he will [facility] to get further details on TB requirements and if need be get them to contact us.

## 2015-11-12 NOTE — Telephone Encounter (Signed)
[facility] tells us since she had a negative Quantiferon Gold TB test recently, she does not need extra test or skin test. But if facility won't accept this, the two-step skin test can be done at the [facility] Department on Monday or Tuesday for $20 per test (the two-step process is one test done then read in 72 hours followed by a second one 2 weeks later). We don't have the Mantoux solution to do skin tests in the office.

## 2015-11-12 NOTE — Telephone Encounter (Signed)
Patient's son in law [name] advised as directed below.

## 2015-11-17 ENCOUNTER — Other Ambulatory Visit: Payer: Self-pay | Admitting: Family Medicine

## 2015-11-19 ENCOUNTER — Ambulatory Visit (INDEPENDENT_AMBULATORY_CARE_PROVIDER_SITE_OTHER): Payer: Medicare Other | Admitting: Physician Assistant

## 2015-11-19 ENCOUNTER — Encounter: Payer: Self-pay | Admitting: Physician Assistant

## 2015-11-19 VITALS — BP 140/58 | HR 96 | Temp 97.9°F | Resp 16

## 2015-11-19 DIAGNOSIS — I872 Venous insufficiency (chronic) (peripheral): Secondary | ICD-10-CM

## 2015-11-19 NOTE — Progress Notes (Signed)
Patient: Brandy Schroeder Female    DOB: Dec 28, 1918   82 y.o.   MRN: [id number] Visit Date: 11/19/2015  Today's Provider: Margaretann Loveless, PA-C   Chief Complaint  Patient presents with  . Bleeding/Bruising    on the right leg   Subjective:    HPI  Brandy Schroeder is here today c/o of bruises/purple spots on her legs that have gotten worse for the past week. She reports more swelling on her legs than usual. She reports is painful sometimes and burning. She reports not hitting herself on anything or falling. She states she was told at her current place of residence [facility]) it could be a blood clot and this had her worried. It is bilateral.  She does have venous insufficiency and has compression stockings but she does not use them often due to difficulty getting them on. She also sits often with legs dependent. She states she does not get much pain in them, just swelling and discoloration on occasion.  She also is in need of new orthotics or refurbishing because her lift side is starting to degrade on the bottom and may put her at fall risk. She has leg length discrepancy of the left leg due to degenerative arthritis of the left hip per son-in-law.     Allergies  Allergen Reactions  . Sulfa Antibiotics    Previous Medications   ALPHA LIPOIC ACID 200 MG CAPS    Take 1 capsule by mouth daily.   AMLODIPINE (NORVASC) 10 MG TABLET    Take 1 tablet (10 mg total) by mouth daily.   CHOLECALCIFEROL (VITAMIN D3) 2000 UNITS TABS    Take 1 tablet by mouth daily.   FUROSEMIDE (LASIX) 40 MG TABLET    TAKE ONE TABLET BY MOUTH ONCE DAILY   KLOR-CON M20 20 MEQ TABLET       TRAZODONE (DESYREL) 150 MG TABLET    TAKE ONE TABLET BY MOUTH ONCE DAILY    Review of Systems  Constitutional: Negative.   Respiratory: Negative for cough, chest tightness and shortness of breath.   Cardiovascular: Positive for leg swelling. Negative for chest pain and palpitations.  Gastrointestinal:  Negative.   Musculoskeletal: Negative.   Neurological: Positive for dizziness (this morning). Negative for light-headedness and headaches.  Hematological: Bruises/bleeds easily.    Social History  Substance Use Topics  . Smoking status: Never Smoker   . Smokeless tobacco: Not on file  . Alcohol Use: No   Objective:   BP 140/58 mmHg  Pulse 96  Temp(Src) 97.9 F (36.6 C) (Oral)  Resp 16  Wt   SpO2 95%  Physical Exam  Constitutional: She appears well-developed and well-nourished. No distress.  Neck: Normal range of motion. Neck supple. No JVD present. No tracheal deviation present. No thyromegaly present.  Cardiovascular: Normal rate, regular rhythm and normal heart sounds.  Exam reveals no gallop and no friction rub.   No murmur heard. Pulses:      Dorsalis pedis pulses are 1+ on the right side, and 1+ on the left side.  Pedal pulses hard to feel secondary to edema  Pulmonary/Chest: Effort normal and breath sounds normal. No respiratory distress. She has no wheezes. She has no rales.  Musculoskeletal: She exhibits edema.  Neg Homan's sign or thompson test. No calf tenderness. 2+ pitting edema bilaterally  Lymphadenopathy:    She has no cervical adenopathy.  Skin: Bruising noted. She is not diaphoretic.  Bruises noted bilaterally on lower  extremities that could be from pressure of sliding herself off her bed vs small ruptured varicosities; non-tender, soft, not warm Varicose veins and small reticular veins noted.  Vitals reviewed.       Assessment & Plan:     1. Venous insufficiency of both lower extremities Advised of conservative measures including elevating her legs, using her compression stockings during the day and remove at night (moving to new assisted living where they offer much more assistance with ADLs), and walking. Call if symptoms worsen or fail to improve. May take tylenol if needed for aching legs.   Will get in touch with orthotics department at [facility]  to see if they, or if they know who she needs to be sent to for either refurbish or new. Will contact them once I know more.        Margaretann LovelessJennifer M Burnette, PA-C  [facility]

## 2015-11-19 NOTE — Patient Instructions (Signed)

## 2015-11-20 ENCOUNTER — Telehealth: Payer: Self-pay | Admitting: Family Medicine

## 2015-11-20 NOTE — Telephone Encounter (Signed)
Brandy Schroeder stated she needs verbal and written orders for pt to be taking the following medications: furosemide (LASIX) 40 MG tablet KLOR-CON M20 20 MEQ tablet Also, the following medications are on pt's FL2 form and wanted to make sure that pt needed to continue taking the following medications: Alpha Lipoic Acid 200 MG CAPS amLODipine (NORVASC) 10 MG tablet Cholecalciferol (VITAMIN D3) 2000 UNITS TABS  Please advise. Thanks TNP

## 2015-11-20 NOTE — Telephone Encounter (Signed)
Written orders to be sent to Fax# [phone]. Thanks TNP

## 2015-11-20 NOTE — Telephone Encounter (Signed)
Lasix and KCL was stopped during the hospital stay for rhabdomyolysis in February 2017. If edema worsening and needing it again, will send order. Should continue Amlodipine for blood pressure, Vitamin D3 for supplementation of deficiency and Alpha Lipoic Acid for any peripheral neuropathy.

## 2015-11-23 NOTE — Telephone Encounter (Signed)
Per Maurine Ministerennis advised Archie Pattenonya with [name] that patient can take Tylenol PRN.

## 2015-11-23 NOTE — Telephone Encounter (Signed)
Brandy Schroeder with [facility] would like to speak with a nurse about the order for Tylenol that they received by fax today. Brandy Schroeder would like to be advised if it is as needed or a standing order. Thanks TNP

## 2015-12-01 ENCOUNTER — Other Ambulatory Visit: Payer: Self-pay

## 2015-12-01 MED ORDER — ALPHA LIPOIC ACID 200 MG PO CAPS: 1.0000 | ORAL_CAPSULE | Freq: Every day | ORAL | Status: DC

## 2015-12-01 MED ORDER — AMLODIPINE BESYLATE 10 MG PO TABS: 10.0000 mg | ORAL_TABLET | Freq: Every day | ORAL | Status: DC

## 2015-12-01 MED ORDER — VITAMIN D3 50 MCG (2000 UT) PO TABS: 1.0000 | ORAL_TABLET | Freq: Every day | ORAL | Status: DC

## 2015-12-01 NOTE — Telephone Encounter (Signed)
[facility] requesting Alpha-Lipoic acid, Amlodipine, Vitamin D.

## 2015-12-18 ENCOUNTER — Encounter: Payer: Self-pay | Admitting: Family Medicine

## 2015-12-18 ENCOUNTER — Ambulatory Visit (INDEPENDENT_AMBULATORY_CARE_PROVIDER_SITE_OTHER): Payer: Medicare Other | Admitting: Family Medicine

## 2015-12-18 VITALS — BP 122/60 | HR 88 | Temp 98.1°F | Resp 16 | Wt 160.0 lb

## 2015-12-18 DIAGNOSIS — R600 Localized edema: Secondary | ICD-10-CM

## 2015-12-18 DIAGNOSIS — R609 Edema, unspecified: Secondary | ICD-10-CM

## 2015-12-18 DIAGNOSIS — N184 Chronic kidney disease, stage 4 (severe): Secondary | ICD-10-CM

## 2015-12-18 NOTE — Progress Notes (Signed)
Patient ID: Brandy JoyChristine M Garrod, female   DOB: 01/15/1919, 82 y.o.   MRN: [id number]       Patient: Brandy Schroeder Female    DOB: 06/18/1919   82 y.o.   MRN: [id number] Visit Date: 12/18/2015  Today's Provider: Dortha Kernennis Chrismon, PA   Chief Complaint  Patient presents with  . Follow-up    2 month F/U.    Subjective:    HPI     Allergies  Allergen Reactions  . Sulfa Antibiotics    Previous Medications   ALPHA LIPOIC ACID 200 MG CAPS    Take 1 capsule by mouth daily.   AMLODIPINE (NORVASC) 10 MG TABLET    Take 1 tablet (10 mg total) by mouth daily.   CHOLECALCIFEROL (VITAMIN D3) 2000 UNITS TABS    Take 1 tablet by mouth daily.   FUROSEMIDE (LASIX) 40 MG TABLET    TAKE ONE TABLET BY MOUTH ONCE DAILY   KLOR-CON M20 20 MEQ TABLET       TRAZODONE (DESYREL) 150 MG TABLET    TAKE ONE TABLET BY MOUTH ONCE DAILY    Review of Systems  Social History  Substance Use Topics  . Smoking status: Never Smoker   . Smokeless tobacco: Not on file  . Alcohol Use: No   Objective:   BP 122/60 mmHg  Pulse 88  Temp(Src) 98.1 F (36.7 C)  Resp 16  Wt 160 lb (72.576 kg) Wt Readings from Last 3 Encounters:  12/18/15 160 lb (72.576 kg)  10/08/15 158 lb 9.6 oz (71.94 kg)  09/05/15 161 lb (73.029 kg)    Physical Exam  Constitutional: She appears well-developed and well-nourished.  HENT:  Head: Normocephalic.  Eyes: Conjunctivae and EOM are normal.  Neck: Neck supple.  Cardiovascular: Normal rate and regular rhythm.   Pulmonary/Chest: Effort normal and breath sounds normal.  Abdominal: Bowel sounds are normal.  Musculoskeletal: She exhibits edema.  2-3+ edema of lower legs and feet. No sores. Good pulses.  Psychiatric: She has a normal mood and affect. Her behavior is normal.      Assessment & Plan:     1. Edema, peripheral Recurring lower extremity swelling with history of venous insufficiency. No rales or dyspnea. Walks some with walker and try to remember to elevate feet  frequently. Not using HCTZ or KCL now. Will check labs and decide if diuretic needed. Advised caregiver to check weight every week and if gaining should recheck the next few days to call report to the office. If gaining more than 3 lbs in a day, may need to restart diuretic. Follow up appointment pending lab report. - Comprehensive metabolic panel - TSH  2. CKD (chronic kidney disease), stage IV (HCC) Last creatinine on 10-12-15 was 1.36. Eating well and restricting salt intake. No longer taking potassium supplement. Recheck CMP and CBC.  - CBC with Differential/Platelet - Comprehensive metabolic panel       Dortha Kernennis Chrismon, PA  [facility]

## 2015-12-19 LAB — CBC WITH DIFFERENTIAL/PLATELET
BASOS ABS: 0 10*3/uL (ref 0.0–0.2)
Basos: 0 %
EOS (ABSOLUTE): 0.1 10*3/uL (ref 0.0–0.4)
Eos: 2 %
HEMOGLOBIN: 12.2 g/dL (ref 11.1–15.9)
Hematocrit: 37.3 % (ref 34.0–46.6)
Immature Grans (Abs): 0 10*3/uL (ref 0.0–0.1)
Immature Granulocytes: 1 %
LYMPHS ABS: 1.1 10*3/uL (ref 0.7–3.1)
Lymphs: 21 %
MCH: 29.6 pg (ref 26.6–33.0)
MCHC: 32.7 g/dL (ref 31.5–35.7)
MCV: 91 fL (ref 79–97)
MONOCYTES: 8 %
MONOS ABS: 0.4 10*3/uL (ref 0.1–0.9)
Neutrophils Absolute: 3.6 10*3/uL (ref 1.4–7.0)
Neutrophils: 68 %
PLATELETS: 238 10*3/uL (ref 150–379)
RBC: 4.12 x10E6/uL (ref 3.77–5.28)
RDW: 14.5 % (ref 12.3–15.4)
WBC: 5.3 10*3/uL (ref 3.4–10.8)

## 2015-12-19 LAB — COMPREHENSIVE METABOLIC PANEL
ALK PHOS: 60 IU/L (ref 39–117)
ALT: 10 IU/L (ref 0–32)
AST: 12 IU/L (ref 0–40)
Albumin/Globulin Ratio: 1.6 (ref 1.2–2.2)
Albumin: 4.4 g/dL (ref 3.2–4.6)
BUN/Creatinine Ratio: 20 (ref 12–28)
BUN: 23 mg/dL (ref 10–36)
Bilirubin Total: 0.4 mg/dL (ref 0.0–1.2)
CHLORIDE: 101 mmol/L (ref 96–106)
CO2: 21 mmol/L (ref 18–29)
CREATININE: 1.14 mg/dL — AB (ref 0.57–1.00)
Calcium: 9.4 mg/dL (ref 8.7–10.3)
GFR calc Af Amer: 48 mL/min/{1.73_m2} — ABNORMAL LOW (ref >59)
GFR calc non Af Amer: 41 mL/min/{1.73_m2} — ABNORMAL LOW (ref >59)
GLUCOSE: 111 mg/dL — AB (ref 65–99)
Globulin, Total: 2.7 g/dL (ref 1.5–4.5)
Potassium: 4.6 mmol/L (ref 3.5–5.2)
Sodium: 143 mmol/L (ref 134–144)
Total Protein: 7.1 g/dL (ref 6.0–8.5)

## 2015-12-19 LAB — TSH: TSH: 10.76 u[IU]/mL — AB (ref 0.450–4.500)

## 2015-12-29 ENCOUNTER — Telehealth: Payer: Self-pay | Admitting: Family Medicine

## 2015-12-29 DIAGNOSIS — E039 Hypothyroidism, unspecified: Secondary | ICD-10-CM

## 2015-12-29 NOTE — Telephone Encounter (Signed)
[name] with [facility] is requesting lab results/MW

## 2015-12-29 NOTE — Telephone Encounter (Signed)
Advise as per lab result note on 12-27-15. Need to get additional thyroid blood testing.

## 2015-12-31 NOTE — Telephone Encounter (Signed)
Katina DungAdvised [name] as below. Printed out lab slip for patient to have thyroid rechecked.

## 2016-01-06 DIAGNOSIS — E039 Hypothyroidism, unspecified: Secondary | ICD-10-CM | POA: Diagnosis not present

## 2016-01-06 DIAGNOSIS — H401414 Capsular glaucoma with pseudoexfoliation of lens, right eye, indeterminate stage: Secondary | ICD-10-CM | POA: Diagnosis not present

## 2016-01-07 LAB — TSH: TSH: 11.13 u[IU]/mL — AB (ref 0.450–4.500)

## 2016-01-07 LAB — T4: T4, Total: 6.4 ug/dL (ref 4.5–12.0)

## 2016-01-07 LAB — T3, FREE: T3 FREE: 2.6 pg/mL (ref 2.0–4.4)

## 2016-01-11 ENCOUNTER — Telehealth: Payer: Self-pay

## 2016-01-11 MED ORDER — LEVOTHYROXINE SODIUM 25 MCG PO TABS: 25.0000 ug | ORAL_TABLET | Freq: Every day | ORAL | Status: DC

## 2016-01-11 NOTE — Telephone Encounter (Signed)
Patient answered phone and was not able to hear. She said it was ok to give her supervisor in charge of her the results. Supervisor reports that her boss usually calls to schedules patient's appointment. ([facility])  Thanks,  -Rachael Ferrie

## 2016-01-11 NOTE — Telephone Encounter (Signed)
-----   Message from Dennis E Chrismon, PA sent at 01/08/2016  3:55 PM EDT ----- Thyroid tests show a little more drop in function. Recommend Levothyroxine 25 mcg qd #30 & 3RF. Schedule appointment to check BP and blood levels in one month. 

## 2016-01-11 NOTE — Telephone Encounter (Signed)
-----   Message from Tamsen Roersennis E Chrismon, [place] sent at 01/08/2016  3:55 PM EDT ----- Thyroid tests show a little more drop in function. Recommend Levothyroxine 25 mcg qd #30 & 3RF. Schedule appointment to check BP and blood levels in one month.

## 2016-01-25 ENCOUNTER — Other Ambulatory Visit: Payer: Self-pay | Admitting: Family Medicine

## 2016-01-25 ENCOUNTER — Ambulatory Visit: Payer: Medicare Other | Admitting: Family Medicine

## 2016-02-12 ENCOUNTER — Encounter: Payer: Self-pay | Admitting: Family Medicine

## 2016-02-12 ENCOUNTER — Ambulatory Visit (INDEPENDENT_AMBULATORY_CARE_PROVIDER_SITE_OTHER): Payer: Medicare Other | Admitting: Family Medicine

## 2016-02-12 VITALS — BP 142/64 | HR 66 | Temp 97.6°F | Resp 12 | Wt 165.0 lb

## 2016-02-12 DIAGNOSIS — N184 Chronic kidney disease, stage 4 (severe): Secondary | ICD-10-CM | POA: Diagnosis not present

## 2016-02-12 DIAGNOSIS — R609 Edema, unspecified: Secondary | ICD-10-CM | POA: Diagnosis not present

## 2016-02-12 DIAGNOSIS — E039 Hypothyroidism, unspecified: Secondary | ICD-10-CM | POA: Diagnosis not present

## 2016-02-12 DIAGNOSIS — G479 Sleep disorder, unspecified: Secondary | ICD-10-CM

## 2016-02-12 MED ORDER — FUROSEMIDE 40 MG PO TABS: 40.0000 mg | ORAL_TABLET | Freq: Every day | ORAL | Status: DC

## 2016-02-12 NOTE — Progress Notes (Signed)
Patient: Brandy Schroeder Female    DOB: 02/19/1919   82 y.o.   MRN: [id number] Visit Date: 02/12/2016  Today's Provider: Dortha Kernennis Chrismon, PA   Chief Complaint  Patient presents with  . Hypothyroidism  . Edema   Subjective:    HPI  Patient is here for follow up.  Hypothyroidism: Patient was started on Levothyroxine 25 mcg in June due to abnormal labs. Lab Results  Component Value Date   TSH 11.130* 01/06/2016   Edema: Patient has been getting weighed and readings have been 162lb and 164lbs usually. Wt Readings from Last 3 Encounters:  02/12/16 165 lb (74.844 kg)  12/18/15 160 lb (72.576 kg)  10/08/15 158 lb 9.6 oz (71.94 kg)   Past Medical History  Diagnosis Date  . Anxiety   . Blind left eye   . Depression   . Hypothyroidism   . OA (osteoarthritis)   . CKD (chronic kidney disease)    Past Surgical History  Procedure Laterality Date  . Eye surgery    . Abdominal hysterectomy    . Cholecystectomy     Family History  Problem Relation Age of Onset  . Arthritis Mother   . Stroke Mother   . Heart disease Brother   . Arthritis Brother    Allergies  Allergen Reactions  . Sulfa Antibiotics    Current Meds  Medication Sig  . acetaminophen (TYLENOL) 500 MG tablet Take 500 mg by mouth every 6 (six) hours as needed.  Allen Kell. Alpha Lipoic Acid 200 MG CAPS Take 1 capsule by mouth daily.  Marland Kitchen. amLODipine (NORVASC) 10 MG tablet Take 1 tablet (10 mg total) by mouth daily.  . Cholecalciferol (VITAMIN D3) 2000 units TABS Take 1 tablet by mouth daily.  Marland Kitchen. latanoprost (XALATAN) 0.005 % ophthalmic solution 1 drop at bedtime.  Marland Kitchen. levothyroxine (SYNTHROID, LEVOTHROID) 25 MCG tablet Take 1 tablet (25 mcg total) by mouth daily before breakfast.  . traZODone (DESYREL) 150 MG tablet TAKE ONE TABLET BY MOUTH AT BEDTIME.    Review of Systems  Constitutional: Negative.   Respiratory: Negative.   Cardiovascular: Positive for leg swelling. Negative for chest pain and palpitations.    Musculoskeletal: Negative.    Social History  Substance Use Topics  . Smoking status: Never Smoker   . Smokeless tobacco: Not on file  . Alcohol Use: No   Objective:   BP 142/64 mmHg  Pulse 66  Temp(Src) 97.6 F (36.4 C)  Resp 12  Wt 165 lb (74.844 kg) BP Readings from Last 3 Encounters:  02/12/16 142/64  12/18/15 122/60  11/19/15 140/58    Physical Exam  Constitutional: She is oriented to person, place, and time. She appears well-developed and well-nourished. No distress.  HENT:  Head: Normocephalic and atraumatic.  Right Ear: Hearing normal.  Left Ear: Hearing normal.  Nose: Nose normal.  Hearing diminished with slight increase in cerumen in the right ear canal.   Eyes: Conjunctivae, EOM and lids are normal. Right eye exhibits no discharge. Left eye exhibits no discharge. No scleral icterus.  Left eyelid ptosis. Glaucoma followed by ophthalmologist.  Neck: Neck supple. No thyromegaly present.  Cardiovascular: Normal rate and regular rhythm.   Pulmonary/Chest: Effort normal and breath sounds normal. No respiratory distress.  Abdominal: Soft. Bowel sounds are normal.  Musculoskeletal: She exhibits edema.  Crepitus in knees. Right knee larger than left. 4+ edema of both lower legs from upper calves to feet. Difficult to palpate pulses. No redness or skin  breakdown.  Lymphadenopathy:    She has no cervical adenopathy.  Neurological: She is alert and oriented to person, place, and time.  Skin: Skin is intact. No lesion and no rash noted.  Psychiatric: She has a normal mood and affect. Her speech is normal and behavior is normal. Thought content normal.      Assessment & Plan:     1. Hypothyroidism, unspecified hypothyroidism type Still on Levothyroxine 25 mg qd. Will check labs to see if dosage adjustment needed to help with peripheral edema. Recheck 3 months. - CBC with Differential/Platelet - Comprehensive metabolic panel - TSH - T4  2. Edema,  peripheral Worsening with 5 lb weight gain since May 2017. Will restart Lasix 40 mg each morning with one Klorcon 20 meq tablet. Must limit heat exposure and recheck labs. - furosemide (LASIX) 40 MG tablet; Take 1 tablet (40 mg total) by mouth daily.  Dispense: 30 tablet; Refill: 3 - CBC with Differential/Platelet - Comprehensive metabolic panel - TSH - T4  3. CKD (chronic kidney disease), stage IV (HCC) Eating well. Will recheck CBC and renal function. - CBC with Differential/Platelet - Comprehensive metabolic panel  4. Sleep disturbance Caregiver states she is up 2-3 times a night. Still taking Trazodone 150 mg hs. Recommend warm shower in the evenings to help her relax and promote emptying her bladder before bedtime. Recheck in 3 months.       Dortha Kern, PA  [facility]

## 2016-02-13 LAB — COMPREHENSIVE METABOLIC PANEL
A/G RATIO: 1.5 (ref 1.2–2.2)
ALBUMIN: 4.4 g/dL (ref 3.2–4.6)
ALT: 13 IU/L (ref 0–32)
AST: 16 IU/L (ref 0–40)
Alkaline Phosphatase: 65 IU/L (ref 39–117)
BILIRUBIN TOTAL: 0.4 mg/dL (ref 0.0–1.2)
BUN / CREAT RATIO: 23 (ref 12–28)
BUN: 24 mg/dL (ref 10–36)
CALCIUM: 9.5 mg/dL (ref 8.7–10.3)
CHLORIDE: 101 mmol/L (ref 96–106)
CO2: 23 mmol/L (ref 18–29)
Creatinine, Ser: 1.05 mg/dL — ABNORMAL HIGH (ref 0.57–1.00)
GFR, EST AFRICAN AMERICAN: 53 mL/min/{1.73_m2} — AB (ref >59)
GFR, EST NON AFRICAN AMERICAN: 46 mL/min/{1.73_m2} — AB (ref >59)
Globulin, Total: 2.9 g/dL (ref 1.5–4.5)
Glucose: 112 mg/dL — ABNORMAL HIGH (ref 65–99)
Potassium: 4.6 mmol/L (ref 3.5–5.2)
SODIUM: 140 mmol/L (ref 134–144)
TOTAL PROTEIN: 7.3 g/dL (ref 6.0–8.5)

## 2016-02-13 LAB — CBC WITH DIFFERENTIAL/PLATELET
BASOS: 0 %
Basophils Absolute: 0 10*3/uL (ref 0.0–0.2)
EOS (ABSOLUTE): 0.1 10*3/uL (ref 0.0–0.4)
EOS: 1 %
HEMATOCRIT: 39.3 % (ref 34.0–46.6)
HEMOGLOBIN: 12.9 g/dL (ref 11.1–15.9)
IMMATURE GRANS (ABS): 0 10*3/uL (ref 0.0–0.1)
Immature Granulocytes: 0 %
LYMPHS: 19 %
Lymphocytes Absolute: 0.9 10*3/uL (ref 0.7–3.1)
MCH: 30.3 pg (ref 26.6–33.0)
MCHC: 32.8 g/dL (ref 31.5–35.7)
MCV: 92 fL (ref 79–97)
MONOCYTES: 11 %
Monocytes Absolute: 0.5 10*3/uL (ref 0.1–0.9)
NEUTROS ABS: 3.2 10*3/uL (ref 1.4–7.0)
Neutrophils: 69 %
Platelets: 249 10*3/uL (ref 150–379)
RBC: 4.26 x10E6/uL (ref 3.77–5.28)
RDW: 14.4 % (ref 12.3–15.4)
WBC: 4.7 10*3/uL (ref 3.4–10.8)

## 2016-02-13 LAB — TSH: TSH: 8.96 u[IU]/mL — AB (ref 0.450–4.500)

## 2016-02-13 LAB — T4: T4 TOTAL: 7.8 ug/dL (ref 4.5–12.0)

## 2016-02-23 ENCOUNTER — Telehealth: Payer: Self-pay | Admitting: Family Medicine

## 2016-02-23 NOTE — Telephone Encounter (Signed)
[name] with [facility] called wanting test results for Brandy Schroeder from her Labs from last week.  Call back is [phone].  Thanks Barth Kirks

## 2016-02-24 ENCOUNTER — Other Ambulatory Visit: Payer: Self-pay | Admitting: Family Medicine

## 2016-02-24 NOTE — Telephone Encounter (Signed)
A request for the Rx traZODone (DESYREL) 150 MG tablet was sent to [facility] yesterday but was not completed.  The nursing home is requesting this get refilled today if possible.

## 2016-02-26 NOTE — Telephone Encounter (Signed)
Katina Dung of lab results from last week. She wanted to let you know that patient's weight is back to 158. She knows that you were concerned about her weight. She will also call back and schedule appt in 2 weeks for a F/U.

## 2016-03-23 ENCOUNTER — Other Ambulatory Visit: Payer: Self-pay | Admitting: Family Medicine

## 2016-03-29 ENCOUNTER — Encounter: Payer: Self-pay | Admitting: Family Medicine

## 2016-03-29 ENCOUNTER — Ambulatory Visit (INDEPENDENT_AMBULATORY_CARE_PROVIDER_SITE_OTHER): Payer: Medicare Other | Admitting: Family Medicine

## 2016-03-29 VITALS — BP 132/60 | HR 85 | Temp 98.1°F | Resp 14 | Wt 164.4 lb

## 2016-03-29 DIAGNOSIS — M79605 Pain in left leg: Secondary | ICD-10-CM

## 2016-03-29 DIAGNOSIS — R609 Edema, unspecified: Secondary | ICD-10-CM | POA: Diagnosis not present

## 2016-03-29 DIAGNOSIS — N184 Chronic kidney disease, stage 4 (severe): Secondary | ICD-10-CM | POA: Diagnosis not present

## 2016-03-29 DIAGNOSIS — Z8739 Personal history of other diseases of the musculoskeletal system and connective tissue: Secondary | ICD-10-CM | POA: Diagnosis not present

## 2016-03-29 DIAGNOSIS — E039 Hypothyroidism, unspecified: Secondary | ICD-10-CM | POA: Diagnosis not present

## 2016-03-29 NOTE — Progress Notes (Signed)
Patient: Brandy Schroeder Female    DOB: [birth year]   82 y.o.   MRN: [id number] Visit Date: 03/29/2016  Today's Provider: Dortha Kern, PA   Chief Complaint  Patient presents with  . Edema   Subjective:    HPI Edema: Patient is here to follow up from 02/12/2016 visit. Patient started Furosemide 40 mg on 02/12/2016. Patient reports swelling has improved with medication.   Past Medical History:  Diagnosis Date  . Anxiety   . Blind left eye   . CKD (chronic kidney disease)   . Depression   . Hypothyroidism   . OA (osteoarthritis)    Past Surgical History:  Procedure Laterality Date  . ABDOMINAL HYSTERECTOMY    . CHOLECYSTECTOMY    . EYE SURGERY     Family History  Problem Relation Age of Onset  . Arthritis Mother   . Stroke Mother   . Heart disease Brother   . Arthritis Brother    Allergies  Allergen Reactions  . Sulfa Antibiotics    Previous Medications   ACETAMINOPHEN (TYLENOL) 500 MG TABLET    Take 500 mg by mouth every 6 (six) hours as needed.   ALPHA-LIPOIC ACID 300 MG CAPS    TAKE (1) CAPSULE BY MOUTH ONCE DAILY.   AMLODIPINE (NORVASC) 10 MG TABLET    TAKE 1 TABLET BY MOUTH ONCE DAILY.   CHOLECALCIFEROL (VITAMIN D3) 2000 UNITS CAPSULE    TAKE (1) CAPSULE BY MOUTH ONCE DAILY.   FUROSEMIDE (LASIX) 40 MG TABLET    Take 1 tablet (40 mg total) by mouth daily.   LATANOPROST (XALATAN) 0.005 % OPHTHALMIC SOLUTION    1 drop at bedtime.   LEVOTHYROXINE (SYNTHROID, LEVOTHROID) 25 MCG TABLET    TAKE (1) TABLET BY MOUTH DAILY BEFORE BREAKFAST.   POTASSIUM CHLORIDE SA (K-DUR,KLOR-CON) 20 MEQ TABLET    TAKE 1 TABLET BY MOUTH ONCE DAILY.   TRAZODONE (DESYREL) 150 MG TABLET    TAKE ONE TABLET BY MOUTH AT BEDTIME.    Review of Systems  Constitutional: Negative.   Respiratory: Negative.   Cardiovascular: Positive for leg swelling.    Social History  Substance Use Topics  . Smoking status: Never Smoker  . Smokeless tobacco: Never Used  . Alcohol use No   Objective:   BP 132/60 (BP Location: Right Arm, Patient Position: Sitting, Cuff Size: Normal)   Pulse 85   Temp 98.1 F (36.7 C) (Oral)   Resp 14   Wt 164 lb 6.4 oz (74.6 kg)   SpO2 96%   BMI 27.36 kg/m  Wt Readings from Last 3 Encounters:  03/29/16 164 lb 6.4 oz (74.6 kg)  02/12/16 165 lb (74.8 kg)  12/18/15 160 lb (72.6 kg)   Physical Exam  Constitutional: She is oriented to person, place, and time. She appears well-developed and well-nourished. No distress.  HENT:  Head: Normocephalic and atraumatic.  Right Ear: Hearing normal.  Left Ear: Hearing normal.  Nose: Nose normal.  Hearing diminished. Not using Miracle Ear aids due to roar.  Eyes: Conjunctivae and lids are normal. Right eye exhibits no discharge. Left eye exhibits no discharge. No scleral icterus.  Neck: Neck supple. No thyromegaly present.  Cardiovascular: Normal rate and regular rhythm.   Pulmonary/Chest: Effort normal and breath sounds normal. No respiratory distress.  Abdominal: Bowel sounds are normal.  Musculoskeletal:  Ambulates with elevated shoe on the left and walker for balance.  Neurological: She is alert and oriented to person, place, and time.  Skin: Skin  is intact. No lesion and no rash noted.  Psychiatric: She has a normal mood and affect. Her speech is normal and behavior is normal. Thought content normal.      Assessment & Plan:     1. Edema, peripheral Improved with use of Furosemide. No dyspnea or wheezing. Recheck CMP and continue Lasix prn edema. Recheck in 3 months. - Comprehensive metabolic panel  2. Left leg pain Secondary to degenerative lumbar intervertebral disc, osteoarthritis and left leg shorter than right requiring use of elevator shoe. Uses a walker for support and balance.  3. H/O: osteoarthritis Arthritis on MRI of both hips.  4. CKD (chronic kidney disease), stage IV (HCC) Creatinine 1.05 on 02-12-16. Feeling well. Lower extremity swelling improved with use of Furosemide. Recheck CMP  and CBC. Follow up pending reports. - Comprehensive metabolic panel - CBC with Differential/Platelet  5. Hypothyroidism, unspecified hypothyroidism type Weight stable. Tolerating levothyroxine 25 mcg qd without palpitations. Thyroid panel on 02-12-16 showed improvement in TSH and normal T4. Continue present regimen.

## 2016-03-30 LAB — COMPREHENSIVE METABOLIC PANEL
A/G RATIO: 1.6 (ref 1.2–2.2)
ALBUMIN: 4.3 g/dL (ref 3.2–4.6)
ALK PHOS: 62 IU/L (ref 39–117)
ALT: 9 IU/L (ref 0–32)
AST: 14 IU/L (ref 0–40)
BILIRUBIN TOTAL: 0.4 mg/dL (ref 0.0–1.2)
BUN / CREAT RATIO: 21 (ref 12–28)
BUN: 28 mg/dL (ref 10–36)
CHLORIDE: 99 mmol/L (ref 96–106)
CO2: 25 mmol/L (ref 18–29)
Calcium: 9.4 mg/dL (ref 8.7–10.3)
Creatinine, Ser: 1.35 mg/dL — ABNORMAL HIGH (ref 0.57–1.00)
GFR calc non Af Amer: 33 mL/min/{1.73_m2} — ABNORMAL LOW (ref >59)
GFR, EST AFRICAN AMERICAN: 38 mL/min/{1.73_m2} — AB (ref >59)
Globulin, Total: 2.7 g/dL (ref 1.5–4.5)
Glucose: 109 mg/dL — ABNORMAL HIGH (ref 65–99)
POTASSIUM: 5.2 mmol/L (ref 3.5–5.2)
Sodium: 142 mmol/L (ref 134–144)
TOTAL PROTEIN: 7 g/dL (ref 6.0–8.5)

## 2016-03-30 LAB — CBC WITH DIFFERENTIAL/PLATELET
Basophils Absolute: 0 10*3/uL (ref 0.0–0.2)
Basos: 0 %
EOS (ABSOLUTE): 0 10*3/uL (ref 0.0–0.4)
Eos: 1 %
Hematocrit: 38.8 % (ref 34.0–46.6)
Hemoglobin: 12.9 g/dL (ref 11.1–15.9)
Immature Grans (Abs): 0.1 10*3/uL (ref 0.0–0.1)
Immature Granulocytes: 1 %
LYMPHS ABS: 1.2 10*3/uL (ref 0.7–3.1)
Lymphs: 15 %
MCH: 30.2 pg (ref 26.6–33.0)
MCHC: 33.2 g/dL (ref 31.5–35.7)
MCV: 91 fL (ref 79–97)
MONOS ABS: 0.5 10*3/uL (ref 0.1–0.9)
Monocytes: 6 %
NEUTROS ABS: 6.3 10*3/uL (ref 1.4–7.0)
Neutrophils: 77 %
PLATELETS: 285 10*3/uL (ref 150–379)
RBC: 4.27 x10E6/uL (ref 3.77–5.28)
RDW: 14.2 % (ref 12.3–15.4)
WBC: 8.2 10*3/uL (ref 3.4–10.8)

## 2016-03-31 ENCOUNTER — Telehealth: Payer: Self-pay

## 2016-03-31 NOTE — Telephone Encounter (Signed)
-----   Message from Tamsen Roersennis E Chrismon, [place] sent at 03/31/2016  4:23 PM EDT ----- All blood tests essentially normal except kidney function stressed again. Drink good amount of fluids and recheck progress in 3-4 months. Continue present medications.

## 2016-03-31 NOTE — Telephone Encounter (Signed)
Advised [facility].

## 2016-04-15 ENCOUNTER — Other Ambulatory Visit: Payer: Self-pay | Admitting: Family Medicine

## 2016-04-15 DIAGNOSIS — R609 Edema, unspecified: Secondary | ICD-10-CM

## 2016-04-25 ENCOUNTER — Encounter: Payer: Self-pay | Admitting: Family Medicine

## 2016-04-25 ENCOUNTER — Ambulatory Visit: Payer: Medicare Other | Admitting: Family Medicine

## 2016-04-25 ENCOUNTER — Ambulatory Visit (INDEPENDENT_AMBULATORY_CARE_PROVIDER_SITE_OTHER): Payer: Medicare Other | Admitting: Family Medicine

## 2016-04-25 VITALS — BP 128/58 | HR 72 | Temp 98.2°F | Wt 164.4 lb

## 2016-04-25 DIAGNOSIS — N3000 Acute cystitis without hematuria: Secondary | ICD-10-CM | POA: Diagnosis not present

## 2016-04-25 DIAGNOSIS — R300 Dysuria: Secondary | ICD-10-CM

## 2016-04-25 LAB — POCT URINALYSIS DIPSTICK
BILIRUBIN UA: NEGATIVE
Glucose, UA: NEGATIVE
KETONES UA: NEGATIVE
Nitrite, UA: POSITIVE
Spec Grav, UA: <=1.005
Urobilinogen, UA: 0.2
pH, UA: 6.5

## 2016-04-25 MED ORDER — AMOXICILLIN-POT CLAVULANATE 875-125 MG PO TABS: 1.0000 | ORAL_TABLET | Freq: Two times a day (BID) | ORAL | 0 refills | Status: DC

## 2016-04-25 NOTE — Progress Notes (Signed)
Patient: Brandy Schroeder Female    DOB: 02-22-1919   82 y.o.   MRN: [id number] Visit Date: 04/25/2016  Today's Provider: Dortha Kern, PA   Chief Complaint  Patient presents with  . Urinary Tract Infection   Subjective:    Urinary Tract Infection   This is a new problem. The current episode started in the past 7 days. The problem occurs intermittently. The problem has been unchanged. The quality of the pain is described as burning. Associated symptoms comments: Nocturia . Treatments tried: cranberry juice. The treatment provided mild relief. chronic kidney disease    Past Medical History:  Diagnosis Date  . Anxiety   . Blind left eye   . CKD (chronic kidney disease)   . Depression   . Hypothyroidism   . OA (osteoarthritis)    Past Surgical History:  Procedure Laterality Date  . ABDOMINAL HYSTERECTOMY    . CHOLECYSTECTOMY    . EYE SURGERY     Family History  Problem Relation Age of Onset  . Arthritis Mother   . Stroke Mother   . Heart disease Brother   . Arthritis Brother    Allergies  Allergen Reactions  . Sulfa Antibiotics      Previous Medications   ACETAMINOPHEN (TYLENOL) 500 MG TABLET    Take 500 mg by mouth every 6 (six) hours as needed.   ALPHA-LIPOIC ACID 300 MG CAPS    TAKE (1) CAPSULE BY MOUTH ONCE DAILY.   AMLODIPINE (NORVASC) 10 MG TABLET    TAKE 1 TABLET BY MOUTH ONCE DAILY.   CHOLECALCIFEROL (VITAMIN D3) 2000 UNITS CAPSULE    TAKE (1) CAPSULE BY MOUTH ONCE DAILY.   FUROSEMIDE (LASIX) 40 MG TABLET    TAKE 1 TABLET BY MOUTH ONCE DAILY.   LATANOPROST (XALATAN) 0.005 % OPHTHALMIC SOLUTION    1 drop at bedtime.   LEVOTHYROXINE (SYNTHROID, LEVOTHROID) 25 MCG TABLET    TAKE (1) TABLET BY MOUTH DAILY BEFORE BREAKFAST.   POTASSIUM CHLORIDE SA (K-DUR,KLOR-CON) 20 MEQ TABLET    TAKE 1 TABLET BY MOUTH ONCE DAILY.   TRAZODONE (DESYREL) 150 MG TABLET    TAKE ONE TABLET BY MOUTH AT BEDTIME.    Review of Systems  Constitutional: Negative.   Cardiovascular:  Negative.   Genitourinary: Positive for dysuria.       Nocturia     Social History  Substance Use Topics  . Smoking status: Never Smoker  . Smokeless tobacco: Never Used  . Alcohol use No   Objective:   BP (!) 128/58 (BP Location: Right Arm, Patient Position: Sitting, Cuff Size: Normal)   Pulse 72   Temp 98.2 F (36.8 C) (Oral)   Wt 164 lb 6.4 oz (74.6 kg)   BMI 27.36 kg/m   Physical Exam  Constitutional: She is oriented to person, place, and time. She appears well-developed and well-nourished. No distress.  HENT:  Head: Normocephalic and atraumatic.  Right Ear: Hearing normal.  Left Ear: Hearing normal.  Nose: Nose normal.  Eyes: Conjunctivae and lids are normal. Right eye exhibits no discharge. Left eye exhibits no discharge. No scleral icterus.  Pulmonary/Chest: Effort normal. No respiratory distress.  Abdominal: Soft. Bowel sounds are normal.  Neurological: She is alert and oriented to person, place, and time.  Skin: Skin is intact. No lesion and no rash noted.  Psychiatric: She has a normal mood and affect. Her speech is normal and behavior is normal. Thought content normal.      Assessment & Plan:  1. Dysuria Onset over the past few days. No fever or confusion. Has complained of some burning with urination. Urinalysis showed large amount of pyuria and positive for nitrites. Will get C&S and start antibiotic. Recheck pending report. - POCT Urinalysis Dipstick - Urine culture  2. Acute cystitis without hematuria Onset over the past few days. Encouraged to drink extra fluids. May use Cranberry juice (4-6 ounces a day) and start antibiotic. Recheck pending C&S report. - POCT Urinalysis Dipstick - Urine culture - amoxicillin-clavulanate (AUGMENTIN) 875-125 MG tablet; Take 1 tablet by mouth 2 (two) times daily.  Dispense: 20 tablet; Refill: 0

## 2016-04-25 NOTE — Patient Instructions (Signed)

## 2016-04-29 ENCOUNTER — Telehealth: Payer: Self-pay

## 2016-04-29 LAB — URINE CULTURE

## 2016-04-29 NOTE — Telephone Encounter (Signed)
Staff member at [facility] advised of patient's lab results. She will call back to schedule a follow up appointment for the week of the 16th-20th.

## 2016-04-29 NOTE — Telephone Encounter (Signed)
-----   Message from Jodell Ciproennis E Fentonhrismon, [place] sent at 04/29/2016  1:46 PM EDT ----- Large amount of two different bacteria causing the urinary tract infection. The Augmentin given should clear both bacteria. Recheck urine specimen in 7-10 days.

## 2016-05-10 ENCOUNTER — Ambulatory Visit (INDEPENDENT_AMBULATORY_CARE_PROVIDER_SITE_OTHER): Payer: Medicare Other | Admitting: Family Medicine

## 2016-05-10 ENCOUNTER — Encounter: Payer: Self-pay | Admitting: Family Medicine

## 2016-05-10 VITALS — BP 118/64 | HR 80 | Temp 98.1°F | Resp 16 | Wt 167.4 lb

## 2016-05-10 DIAGNOSIS — N3000 Acute cystitis without hematuria: Secondary | ICD-10-CM | POA: Diagnosis not present

## 2016-05-10 DIAGNOSIS — R300 Dysuria: Secondary | ICD-10-CM | POA: Diagnosis not present

## 2016-05-10 LAB — POCT URINALYSIS DIPSTICK
Bilirubin, UA: NEGATIVE
GLUCOSE UA: NEGATIVE
Ketones, UA: NEGATIVE
NITRITE UA: NEGATIVE
Spec Grav, UA: 1.02
UROBILINOGEN UA: 0.2
pH, UA: 5

## 2016-05-10 MED ORDER — AMOXICILLIN-POT CLAVULANATE 875-125 MG PO TABS: 1.0000 | ORAL_TABLET | Freq: Two times a day (BID) | ORAL | 0 refills | Status: DC

## 2016-05-10 NOTE — Progress Notes (Signed)
Patient: Brandy Schroeder Female    DOB: 11/19/1918   82 y.o.   MRN: [id number] Visit Date: 05/10/2016  Today's Provider: Dortha Kernennis Chrismon, PA   Chief Complaint  Patient presents with  . Urinary Tract Infection  . Follow-up   Subjective:    HPI Patient is here for a 2 week follow up for UTI. Patient was last seen on 04/25/2016. She was prescribed Augmentin on 04/25/2016. Urine culture results showed large amounts of 2 different bacteria. Patient was advised to finish antibiotics and follow up in 2 weeks. Patient reports excellent compliance with treatment plan. Patient reports she is still burning when urinating.   Past Medical History:  Diagnosis Date  . Anxiety   . Blind left eye   . CKD (chronic kidney disease)   . Depression   . Hypothyroidism   . OA (osteoarthritis)    Past Surgical History:  Procedure Laterality Date  . ABDOMINAL HYSTERECTOMY    . CHOLECYSTECTOMY    . EYE SURGERY     Family History  Problem Relation Age of Onset  . Arthritis Mother   . Stroke Mother   . Heart disease Brother   . Arthritis Brother    Allergies  Allergen Reactions  . Sulfa Antibiotics      Previous Medications   ACETAMINOPHEN (TYLENOL) 500 MG TABLET    Take 500 mg by mouth every 6 (six) hours as needed.   ALPHA-LIPOIC ACID 300 MG CAPS    TAKE (1) CAPSULE BY MOUTH ONCE DAILY.   AMLODIPINE (NORVASC) 10 MG TABLET    TAKE 1 TABLET BY MOUTH ONCE DAILY.   CHOLECALCIFEROL (VITAMIN D3) 2000 UNITS CAPSULE    TAKE (1) CAPSULE BY MOUTH ONCE DAILY.   FUROSEMIDE (LASIX) 40 MG TABLET    TAKE 1 TABLET BY MOUTH ONCE DAILY.   LATANOPROST (XALATAN) 0.005 % OPHTHALMIC SOLUTION    1 drop at bedtime.   LEVOTHYROXINE (SYNTHROID, LEVOTHROID) 25 MCG TABLET    TAKE (1) TABLET BY MOUTH DAILY BEFORE BREAKFAST.   POTASSIUM CHLORIDE SA (K-DUR,KLOR-CON) 20 MEQ TABLET    TAKE 1 TABLET BY MOUTH ONCE DAILY.   TRAZODONE (DESYREL) 150 MG TABLET    TAKE ONE TABLET BY MOUTH AT BEDTIME.    Review of Systems    Constitutional: Negative.   Respiratory: Negative.   Cardiovascular: Negative.   Genitourinary: Positive for dysuria.    Social History  Substance Use Topics  . Smoking status: Never Smoker  . Smokeless tobacco: Never Used  . Alcohol use No   Objective:   BP 118/64 (BP Location: Right Arm, Patient Position: Sitting, Cuff Size: Normal)   Pulse 80   Temp 98.1 F (36.7 C) (Oral)   Resp 16   Wt 167 lb 6.4 oz (75.9 kg)   BMI 27.86 kg/m   Physical Exam  Constitutional: She is oriented to person, place, and time. She appears well-developed and well-nourished. No distress.  HENT:  Head: Normocephalic and atraumatic.  Right Ear: Hearing normal.  Left Ear: Hearing normal.  Nose: Nose normal.  Eyes: Conjunctivae and lids are normal. Right eye exhibits no discharge. Left eye exhibits no discharge. No scleral icterus.  Cardiovascular: Normal rate and regular rhythm.   Pulmonary/Chest: Effort normal and breath sounds normal. No respiratory distress.  Abdominal: Soft. Bowel sounds are normal.  Neurological: She is alert and oriented to person, place, and time.  Skin: Skin is intact. No lesion and no rash noted.  Psychiatric: She has a normal mood and  affect. Her speech is normal and behavior is normal. Thought content normal.      Assessment & Plan:     1. Acute cystitis without hematuria Finished he first round of Augmentin. Culture on 04-25-16 isolated E. Coli and Proteus Mirabilis that was sensitive to the Augmentin. Urinalysis shows large amount of WBC's again. Will re-culture and refill Augmentin for 10 days. Caregiver cleans her up after BM's and launders closes. No sign of stool contamination or vaginal discharge. No fevers at home. Encouraged to drink extra fluids and recheck pending culture report. May have to refer to urologist. - POCT Urinalysis Dipstick - Urine culture - amoxicillin-clavulanate (AUGMENTIN) 875-125 MG tablet; Take 1 tablet by mouth 2 (two) times daily.   Dispense: 20 tablet; Refill: 0

## 2016-05-12 ENCOUNTER — Telehealth: Payer: Self-pay

## 2016-05-12 LAB — URINE CULTURE

## 2016-05-12 NOTE — Telephone Encounter (Signed)
[facility] advised of patient's lab results. [facility].

## 2016-05-12 NOTE — Telephone Encounter (Signed)
-----   Message from Tamsen Roersennis E Chrismon, [place] sent at 05/12/2016  2:27 PM EDT ----- Only Proteus Mirabilis bacteria remains on culture. Still sensitive to the Augmentin (amoxicillin-clavulanic acid) given. Finish all the antibiotic and recheck specimen for cure in 2 weeks.

## 2016-05-13 ENCOUNTER — Telehealth: Payer: Self-pay | Admitting: Family Medicine

## 2016-05-13 MED ORDER — CLOTRIMAZOLE 2 % VA CREA: TOPICAL_CREAM | VAGINAL | 0 refills | Status: DC

## 2016-05-13 MED ORDER — CLOTRIMAZOLE 2 % VA CREA: 1.0000 | TOPICAL_CREAM | Freq: Two times a day (BID) | VAGINAL | 0 refills | Status: DC

## 2016-05-13 NOTE — Telephone Encounter (Signed)
RX sent to pharmacy for yeast infection. [name].

## 2016-05-13 NOTE — Telephone Encounter (Signed)
[facility]andy [facility] with [facility] stated pt is still having burning when she voids and at times burning when she isn't voiding. Brandy Schroeder stated that she was advised to call back to get orders if the burning continued. Since she is coming today to pick up copies of lab results since their fax machine was down yesterday she would like to get the orders with the lab results. Lab results are up front. Please advise. Thanks TNP

## 2016-05-13 NOTE — Telephone Encounter (Signed)
Since she has recently been on antibiotics frequently, recommend trying Lotrimin Vaginal cream twice a day externally. If no relief over the weekend, will schedule urology referral.

## 2016-05-16 ENCOUNTER — Other Ambulatory Visit: Payer: Self-pay | Admitting: Family Medicine

## 2016-05-16 MED ORDER — MICONAZOLE NITRATE 2 % VA CREA: 1.0000 | TOPICAL_CREAM | Freq: Every day | VAGINAL | 0 refills | Status: DC

## 2016-05-16 NOTE — Telephone Encounter (Signed)
[name]. D/C order faxed to [phone] to discontinue use of clotrimazole vaginal cream.

## 2016-05-16 NOTE — Telephone Encounter (Signed)
Change to Miconazole vaginally at bedtime for 7 days. Call report of progress by end of the week.

## 2016-05-16 NOTE — Telephone Encounter (Signed)
[name] saying they received the Clotrimazole cream but it was only enough for three days.  The instructions were for twice a day for 3 days but they only had enough for one time a day for 3 days.  She is still having symptoms of burning.    They either need a refill or different RX.Marland Kitchen. Please advise.  [facility] [place].  [facility] call back is [phone]  Thanks, Barth Kirksteri

## 2016-05-17 ENCOUNTER — Other Ambulatory Visit: Payer: Self-pay | Admitting: Family Medicine

## 2016-05-17 NOTE — Telephone Encounter (Signed)
Last ov 02/12/16 labs stable

## 2016-05-20 ENCOUNTER — Telehealth: Payer: Self-pay | Admitting: Family Medicine

## 2016-05-20 NOTE — Telephone Encounter (Signed)
Spoke with [facility]. Patient states she is itching and scratching to the point of whelps. Archie Pattenonya reports patient does not have any redness, or hives. [facility] is requesting a order for Hydrocortisone cream or something to help with the itching.   Patient has 3 days left of miconazole.   Fax order to [phone]

## 2016-05-20 NOTE — Telephone Encounter (Signed)
If this is a genital itch, should schedule examination. May use Hydrocortisone 2% Cream TID 30 gm on an external rash.

## 2016-05-20 NOTE — Telephone Encounter (Signed)
[facility] states pt is scrating her arms and back where she can reach.  Archie Pattenonya states they are putting lotion and it helps for a little while.  Pleas advise.  [facility] [place].  CB#[phone]/MW

## 2016-05-20 NOTE — Telephone Encounter (Signed)
Rash is external only per Tonya. [facility] to [facility] at [phone].

## 2016-05-21 DIAGNOSIS — T788XXA Other adverse effects, not elsewhere classified, initial encounter: Secondary | ICD-10-CM | POA: Diagnosis not present

## 2016-05-23 ENCOUNTER — Telehealth: Payer: Self-pay | Admitting: Family Medicine

## 2016-05-23 NOTE — Telephone Encounter (Signed)
Spoke with [name] at [phone]. She reports that patient was taken to a walking clinic on Saturday because patient broke into a rash and hives. She reports it was an allergic reaction. Patient had just finished the antibiotic that she was taking for her UTI. She was taken off of the Monistat. Per [name] patient looks better. She was prescribed Prednisone and Loratadine. She is on the third day of the Prednisone, Per [name] patient has an appointment with Maurine Ministerennis scheduled in a few weeks but wanted to know if he wanted to see patient sooner.  Please advise.  Thanks.  Sherlene Shams-Joseline

## 2016-05-23 NOTE — Telephone Encounter (Signed)
Brandy Schroeder reported they had to take Brandy Schroeder to the walk in Saturday.  She was having rash and itching.  They need to report you Brandy Schroeder what the findings were.  Please call Brandy Schroeder at [phone].  Thanks Brandy Schroeder

## 2016-05-23 NOTE — Telephone Encounter (Signed)
Recheck in 1 week  °

## 2016-05-24 NOTE — Telephone Encounter (Signed)
[facility]. Patient scheduled for 1 week follow up.

## 2016-05-31 ENCOUNTER — Encounter: Payer: Self-pay | Admitting: Family Medicine

## 2016-05-31 ENCOUNTER — Telehealth: Payer: Self-pay | Admitting: Family Medicine

## 2016-05-31 ENCOUNTER — Ambulatory Visit (INDEPENDENT_AMBULATORY_CARE_PROVIDER_SITE_OTHER): Payer: Medicare Other | Admitting: Family Medicine

## 2016-05-31 VITALS — BP 128/58 | HR 80 | Temp 98.1°F | Resp 16 | Wt 167.4 lb

## 2016-05-31 DIAGNOSIS — N390 Urinary tract infection, site not specified: Secondary | ICD-10-CM | POA: Diagnosis not present

## 2016-05-31 DIAGNOSIS — Z23 Encounter for immunization: Secondary | ICD-10-CM

## 2016-05-31 DIAGNOSIS — N9089 Other specified noninflammatory disorders of vulva and perineum: Secondary | ICD-10-CM

## 2016-05-31 LAB — POCT URINALYSIS DIPSTICK
BILIRUBIN UA: NEGATIVE
GLUCOSE UA: NEGATIVE
KETONES UA: NEGATIVE
Nitrite, UA: POSITIVE
SPEC GRAV UA: 1.015
Urobilinogen, UA: 0.2
pH, UA: 6

## 2016-05-31 MED ORDER — ESTROGENS, CONJUGATED 0.625 MG/GM VA CREA: TOPICAL_CREAM | VAGINAL | 0 refills | Status: DC

## 2016-05-31 MED ORDER — ESTRADIOL 0.1 MG/GM VA CREA: 1.0000 | TOPICAL_CREAM | Freq: Every day | VAGINAL | 0 refills | Status: DC

## 2016-05-31 NOTE — Telephone Encounter (Signed)
Okay to change to Estrace per Maurine Ministerennis. Rx sent to pharmacy. [name] with [facility] advised.

## 2016-05-31 NOTE — Telephone Encounter (Signed)
May substitute Estrace cream for the Premarin with the same instructions.

## 2016-05-31 NOTE — Progress Notes (Signed)
Patient: Brandy Schroeder Female    DOB: 03/07/1919   82 y.o.   MRN: [id number] Visit Date: 05/31/2016  Today's Provider: Dortha Kernennis Nora Rooke, PA   Chief Complaint  Patient presents with  . Urinary Tract Infection  . Rash  . Follow-up   Subjective:    Urinary Tract Infection   This is a recurrent problem. The quality of the pain is described as burning. Associated symptoms comments: Foul smelling urine . She has tried antibiotics for the symptoms. The treatment provided mild relief.   Patient is also here today to follow up on a rash that started while taking antibiotics and using monistat for a UTI. Patient was seen at the walk-in clinic and prescribed Prednisone and Loratadine for rash. Patient reports excellent compliance with treatment plan. Symptoms have improved.  Past Medical History:  Diagnosis Date  . Anxiety   . Blind left eye   . CKD (chronic kidney disease)   . Depression   . Hypothyroidism   . OA (osteoarthritis)    Past Surgical History:  Procedure Laterality Date  . ABDOMINAL HYSTERECTOMY    . CHOLECYSTECTOMY    . EYE SURGERY     Family History  Problem Relation Age of Onset  . Arthritis Mother   . Stroke Mother   . Heart disease Brother   . Arthritis Brother    Allergies  Allergen Reactions  . Sulfa Antibiotics      Previous Medications   ACETAMINOPHEN (TYLENOL) 500 MG TABLET    Take 500 mg by mouth every 6 (six) hours as needed.   ALPHA-LIPOIC ACID 300 MG CAPS    TAKE (1) CAPSULE BY MOUTH ONCE DAILY.   AMLODIPINE (NORVASC) 10 MG TABLET    TAKE 1 TABLET BY MOUTH ONCE DAILY.   CHOLECALCIFEROL (VITAMIN D3) 2000 UNITS CAPSULE    TAKE (1) CAPSULE BY MOUTH ONCE DAILY.   FUROSEMIDE (LASIX) 40 MG TABLET    TAKE 1 TABLET BY MOUTH ONCE DAILY.   LATANOPROST (XALATAN) 0.005 % OPHTHALMIC SOLUTION    1 drop at bedtime.   LEVOTHYROXINE (SYNTHROID, LEVOTHROID) 25 MCG TABLET    TAKE (1) TABLET BY MOUTH DAILY BEFORE BREAKFAST.   POTASSIUM CHLORIDE SA (K-DUR,KLOR-CON)  20 MEQ TABLET    TAKE 1 TABLET BY MOUTH ONCE DAILY.   TRAZODONE (DESYREL) 150 MG TABLET    TAKE ONE TABLET BY MOUTH AT BEDTIME.    Review of Systems  Constitutional: Negative.   Respiratory: Negative.   Genitourinary: Positive for dysuria.    Social History  Substance Use Topics  . Smoking status: Never Smoker  . Smokeless tobacco: Never Used  . Alcohol use No   Objective:   BP (!) 128/58 (BP Location: Right Arm, Patient Position: Sitting, Cuff Size: Normal)   Pulse 80   Temp 98.1 F (36.7 C) (Oral)   Resp 16   Wt 167 lb 6.4 oz (75.9 kg)   BMI 27.86 kg/m   Physical Exam  Constitutional: She is oriented to person, place, and time. She appears well-developed and well-nourished. No distress.  HENT:  Head: Normocephalic and atraumatic.  Right Ear: Hearing normal.  Left Ear: Hearing normal.  Nose: Nose normal.  Eyes: Conjunctivae and lids are normal. Right eye exhibits no discharge. Left eye exhibits no discharge. No scleral icterus.  Pulmonary/Chest: Effort normal. No respiratory distress.  Abdominal: Soft. She exhibits no mass. There is no tenderness.  Genitourinary: No vaginal discharge found.  Genitourinary Comments: Left vaginal introitus has a ridge of  tender thickening. No rash or vaginal discharge.   Neurological: She is alert and oriented to person, place, and time.  Skin: Skin is intact. No lesion and no rash noted.  Psychiatric: She has a normal mood and affect. Her speech is normal and behavior is normal. Thought content normal.      Assessment & Plan:     1. Recurrent UTI Urinalysis shows nitrites positive with large amount of leukocytes. Will recheck urine culture. Was treated with Augmentin a couple weeks ago and developed a suspected allergic rash. Was evaluated at a walk-in clinic on 05-21-16 and given Loratadine with Prednisone. Pending lab reports, may need a different antibiotic. Encouraged to drink extra fluids. - POCT Urinalysis Dipstick - Urine  culture  2. Vulvar irritation Some burning with evidence of suspected atrophic vaginitis. Will make Premarin Cream available to apply topically twice a week. Recheck as needed.  3. Need for influenza vaccination Will need to postpone with recurrence of UTI.

## 2016-05-31 NOTE — Telephone Encounter (Signed)
[name] from [facility] called saying they went to pick up Ms.Christines rx for premarin was 100.00 for the copay.  [facility] told them they might could substitute Estrace.  Please advise [facility] if ok.  Call back is  [phone]  [facility]

## 2016-06-01 ENCOUNTER — Telehealth: Payer: Self-pay | Admitting: Family Medicine

## 2016-06-01 NOTE — Telephone Encounter (Signed)
[name] called needing clarification on the Estrace Cream that was prescribed to Mrs. Delford FieldWright.  Her call back is [phone]  Thanks Barth Kirkseri

## 2016-06-01 NOTE — Telephone Encounter (Signed)
Spoke with [name] on the phone she states in office you had prescribed Premarin to apply to vaginal area topically. [name] states that prescription was changed to Estrace because cost of premarin was to high, script was written as applicator vaginally at bedtime. [name] says patient does not have irritation internally but externally. Cathleen CortiBabara would like to know if you will change Rx or did you mean for patient to take it as directed? She was advised that you are out of the office this afternoon and will return in morning. KW

## 2016-06-01 NOTE — Telephone Encounter (Signed)
Unable to reach EagleBarbara at this time line is busy, will try contacting back again at a later time. KW

## 2016-06-01 NOTE — Telephone Encounter (Signed)
[id number] 

## 2016-06-02 NOTE — Telephone Encounter (Signed)
Brandy Schroeder advised as directed below.

## 2016-06-02 NOTE — Telephone Encounter (Signed)
Left message for [name] at [facility] to return call.

## 2016-06-02 NOTE — Telephone Encounter (Signed)
Irritation is almost internal on exam. May use topically each night for a week then twice a week. Don't have to use internally for now.

## 2016-06-03 ENCOUNTER — Telehealth: Payer: Self-pay

## 2016-06-03 LAB — URINE CULTURE

## 2016-06-03 MED ORDER — CIPROFLOXACIN HCL 250 MG PO TABS: 250.0000 mg | ORAL_TABLET | Freq: Two times a day (BID) | ORAL | 0 refills | Status: DC

## 2016-06-03 NOTE — Telephone Encounter (Signed)
Wrote order to take the Cipro 250 mg BID for a week then check urinalysis in 10 days. [facility]

## 2016-06-03 NOTE — Telephone Encounter (Signed)
-----   Message from Jodell CiproDennis E Norwoodhrismon, [place] sent at 06/03/2016  1:58 PM EST ----- Urine culture isolated citrobacter bacteria that is resistant to the Augmentin used for the last UTI. This is a different bacteria from the last infection. Recommend Ciprofloxacin 250 mg BID #14. Recheck urinalysis in 10 days.

## 2016-06-03 NOTE — Telephone Encounter (Signed)
Order has been faxed. KW 

## 2016-06-03 NOTE — Telephone Encounter (Signed)
Spoke with caregiver of patient and advised of urine culture, [facility]. Caregiver request that you send in order to administer medication to patient and fax order to [phone] [name].KW

## 2016-06-06 ENCOUNTER — Telehealth: Payer: Self-pay | Admitting: Family Medicine

## 2016-06-07 ENCOUNTER — Telehealth: Payer: Self-pay | Admitting: Family Medicine

## 2016-06-07 DIAGNOSIS — M2170 Unequal limb length (acquired), unspecified site: Secondary | ICD-10-CM

## 2016-06-07 NOTE — Telephone Encounter (Signed)
Brandy [facility] with [name] called stating pt family would like to get her a new pair of shoes and pt needs a special made shoe due to one leg is longer than the other.  Brandy Schroeder states pt will need to see Dr Gala LewandowskyBrent Evans at [facility] for the special made shoe and a referral is needed.  [phone]/WJCB#[phone]/MW

## 2016-06-10 NOTE — Telephone Encounter (Signed)
Schedule referral to [facility] Logan BoresEvans at [facility] if this is where she got the last pair of shoes. The reason for the shoes has been for unequal leg length.

## 2016-06-10 NOTE — Telephone Encounter (Signed)
Referral placed.

## 2016-06-13 ENCOUNTER — Other Ambulatory Visit: Payer: Self-pay | Admitting: Family Medicine

## 2016-06-13 DIAGNOSIS — R609 Edema, unspecified: Secondary | ICD-10-CM

## 2016-06-28 ENCOUNTER — Ambulatory Visit (INDEPENDENT_AMBULATORY_CARE_PROVIDER_SITE_OTHER): Payer: Medicare Other | Admitting: Family Medicine

## 2016-06-28 ENCOUNTER — Encounter: Payer: Self-pay | Admitting: Family Medicine

## 2016-06-28 VITALS — BP 128/60 | HR 84 | Temp 97.7°F | Resp 16 | Wt 173.2 lb

## 2016-06-28 DIAGNOSIS — R609 Edema, unspecified: Secondary | ICD-10-CM

## 2016-06-28 DIAGNOSIS — T148XXA Other injury of unspecified body region, initial encounter: Secondary | ICD-10-CM | POA: Diagnosis not present

## 2016-06-28 DIAGNOSIS — E039 Hypothyroidism, unspecified: Secondary | ICD-10-CM

## 2016-06-28 DIAGNOSIS — L089 Local infection of the skin and subcutaneous tissue, unspecified: Secondary | ICD-10-CM | POA: Diagnosis not present

## 2016-06-28 DIAGNOSIS — N184 Chronic kidney disease, stage 4 (severe): Secondary | ICD-10-CM | POA: Diagnosis not present

## 2016-06-28 DIAGNOSIS — H6123 Impacted cerumen, bilateral: Secondary | ICD-10-CM

## 2016-06-28 MED ORDER — CEPHALEXIN 500 MG PO CAPS: 500.0000 mg | ORAL_CAPSULE | Freq: Three times a day (TID) | ORAL | 0 refills | Status: DC

## 2016-06-28 NOTE — Progress Notes (Signed)
Patient: Brandy Schroeder Female    DOB: 03/27/1919   82 y.o.   MRN: [id number] Visit Date: 06/28/2016  Today's Provider: Dortha Kernennis Alfonzo Arca, PA   Chief Complaint  Patient presents with  . Follow-up   Subjective:    HPI Patient is here for 3 month follow up.    Hypothyroid, follow-up:  TSH  Date Value Ref Range Status  02/12/2016 8.960 (H) 0.450 - 4.500 uIU/mL Final  01/06/2016 11.130 (H) 0.450 - 4.500 uIU/mL Final  12/18/2015 10.760 (H) 0.450 - 4.500 uIU/mL Final   Wt Readings from Last 3 Encounters:  06/28/16 173 lb 3.2 oz (78.6 kg)  05/31/16 167 lb 6.4 oz (75.9 kg)  05/10/16 167 lb 6.4 oz (75.9 kg)    She was last seen for hypothyroid 3 months ago.  Management since that visit includes continue medication. She reports excellent compliance with treatment. She is not having side effects.  She is not exercising. She is experiencing weight changes She denies change in energy level, diarrhea, heat / cold intolerance, nervousness and palpitations Weight trend:increasing  ------------------------------------------------------------------------ Patient Active Problem List   Diagnosis Date Noted  . Multiple falls 09/05/2015  . Paroxysmal a-fib (HCC) 09/05/2015  . Acute on chronic renal failure (HCC) 09/05/2015  . Rhabdomyolysis 09/05/2015  . Anxiety 11/19/2014  . 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 Black stool 11/19/2014  . Degeneration of lumbar or lumbosacral intervertebral disc 11/19/2014  . Clinical depression 11/19/2014  . H/O: osteoarthritis 11/19/2014  . Adult hypothyroidism 11/19/2014  . Left leg pain 11/19/2014  . Primary localized osteoarthrosis, lower leg 11/19/2014  . Edema, peripheral 11/19/2014  . CKD (chronic kidney disease), stage IV (HCC) 11/19/2014  . Acquired spondylolisthesis 11/19/2014  . Dermatitis, stasis 11/19/2014   Past Surgical History:  Procedure Laterality Date  . ABDOMINAL HYSTERECTOMY    . CHOLECYSTECTOMY    . EYE SURGERY     Family  History  Problem Relation Age of Onset  . Arthritis Mother   . Stroke Mother   . Heart disease Brother   . Arthritis Brother    Allergies  Allergen Reactions  . Sulfa Antibiotics      Previous Medications   ACETAMINOPHEN (TYLENOL) 500 MG TABLET    Take 500 mg by mouth every 6 (six) hours as needed.   ALPHA-LIPOIC ACID 300 MG CAPS    TAKE (1) CAPSULE BY MOUTH ONCE DAILY.   AMLODIPINE (NORVASC) 10 MG TABLET    TAKE 1 TABLET BY MOUTH ONCE DAILY.   CHOLECALCIFEROL (VITAMIN D3) 2000 UNITS CAPSULE    TAKE (1) CAPSULE BY MOUTH ONCE DAILY.   ESTRADIOL (ESTRACE VAGINAL) 0.1 MG/GM VAGINAL CREAM    Place 1 Applicatorful vaginally at bedtime.   FUROSEMIDE (LASIX) 40 MG TABLET    TAKE 1 TABLET BY MOUTH ONCE DAILY.   LATANOPROST (XALATAN) 0.005 % OPHTHALMIC SOLUTION    1 drop at bedtime.   LEVOTHYROXINE (SYNTHROID, LEVOTHROID) 25 MCG TABLET    TAKE (1) TABLET BY MOUTH DAILY BEFORE BREAKFAST.   POTASSIUM CHLORIDE SA (K-DUR,KLOR-CON) 20 MEQ TABLET    TAKE 1 TABLET BY MOUTH ONCE DAILY.   TRAZODONE (DESYREL) 150 MG TABLET    TAKE ONE TABLET BY MOUTH AT BEDTIME.    Review of Systems  Constitutional: Negative.   Respiratory: Negative.   Cardiovascular: Positive for leg swelling.  Endocrine: Negative.     Social History  Substance Use Topics  . Smoking status: Never Smoker  . Smokeless tobacco: Never Used  . Alcohol use  No   Objective:   BP 128/60 (BP Location: Right Arm, Patient Position: Sitting, Cuff Size: Normal)   Pulse 84   Temp 97.7 F (36.5 C) (Oral)   Resp 16   Wt 173 lb 3.2 oz (78.6 kg)   BMI 28.82 kg/m   Physical Exam  Constitutional: She is oriented to person, place, and time. She appears well-developed and well-nourished.  HENT:  Head: Normocephalic.  Mouth/Throat: Oropharynx is clear and moist.  Hearing loss with large amount of cerumen bilaterally.  Eyes: Conjunctivae are normal.  Neck: Neck supple. No thyromegaly present.  Cardiovascular: Normal rate and regular  rhythm.   Pulmonary/Chest: Breath sounds normal.  Abdominal: Soft.  Musculoskeletal: She exhibits edema.  Peripheral edema (R>L). Erythema around the right lower leg above the ankle with posterior skin tear scab.  Lymphadenopathy:    She has no cervical adenopathy.  Neurological: She is alert and oriented to person, place, and time.  Skin: There is erythema.  Right lower leg with bruising anteriorly. Triangular skin tear right lower posterior calf. No drainage. Slight soreness and pitting edema.     Assessment & Plan:     1. Adult hypothyroidism Tolerating Levothyroxine 25 mcg qd. Some constipation intermittently. History of chronic peripheral edema. Finds use of support hose keeps edema build up down. No palpitations or tremors. Will continue present dosage and recheck labs. - CBC with Differential/Platelet - TSH - T4  2. Edema, peripheral Has gained about 6 lbs since last office visit. Still getting Lasix 40 mg qd. Encouraged to restrict sodium in diet. Pending lab reports, may need an additional dose of the diuretic.  3. CKD (chronic kidney disease), stage IV (HCC) Will check follow up labs.  - CBC with Differential/Platelet - Comprehensive metabolic panel  4. Bilateral impacted cerumen Hearing very much impaired. Attempted bilateral irrigation with significant amount of cerumen remaining. Has had more success with cleaning by audiologist that got her some hearing aids. Will go back for another attempt to clear cerumen out.  5. Infected skin tear Onset over the past week. Unsure as to the actual incident causing this skin tear on the back of the lower leg. Swelling and surrounding erythema with large bruise on the shin. Skin not very hot but some soreness. Pulses are symmetric in feet. Will give antibiotic and encouraged to cover scab to protect from contamination and dislodging it. Recheck in 7-10 days. - cephALEXin (KEFLEX) 500 MG capsule; Take 1 capsule (500 mg total) by mouth  3 (three) times daily.  Dispense: 21 capsule; Refill: 0 - CBC with Differential/Platelet

## 2016-06-29 LAB — TSH: TSH: 15.36 u[IU]/mL — AB (ref 0.450–4.500)

## 2016-06-29 LAB — CBC WITH DIFFERENTIAL/PLATELET
BASOS ABS: 0 10*3/uL (ref 0.0–0.2)
Basos: 0 %
EOS (ABSOLUTE): 0.1 10*3/uL (ref 0.0–0.4)
Eos: 1 %
Hematocrit: 37.2 % (ref 34.0–46.6)
Hemoglobin: 12.2 g/dL (ref 11.1–15.9)
IMMATURE GRANS (ABS): 0.1 10*3/uL (ref 0.0–0.1)
IMMATURE GRANULOCYTES: 1 %
LYMPHS: 14 %
Lymphocytes Absolute: 0.9 10*3/uL (ref 0.7–3.1)
MCH: 30.2 pg (ref 26.6–33.0)
MCHC: 32.8 g/dL (ref 31.5–35.7)
MCV: 92 fL (ref 79–97)
MONOS ABS: 0.7 10*3/uL (ref 0.1–0.9)
Monocytes: 11 %
NEUTROS ABS: 4.8 10*3/uL (ref 1.4–7.0)
NEUTROS PCT: 73 %
PLATELETS: 256 10*3/uL (ref 150–379)
RBC: 4.04 x10E6/uL (ref 3.77–5.28)
RDW: 13.5 % (ref 12.3–15.4)
WBC: 6.5 10*3/uL (ref 3.4–10.8)

## 2016-06-29 LAB — COMPREHENSIVE METABOLIC PANEL
A/G RATIO: 1.6 (ref 1.2–2.2)
ALT: 16 IU/L (ref 0–32)
AST: 17 IU/L (ref 0–40)
Albumin: 4.1 g/dL (ref 3.2–4.6)
Alkaline Phosphatase: 66 IU/L (ref 39–117)
BILIRUBIN TOTAL: 0.3 mg/dL (ref 0.0–1.2)
BUN/Creatinine Ratio: 26 (ref 12–28)
BUN: 34 mg/dL (ref 10–36)
CHLORIDE: 100 mmol/L (ref 96–106)
CO2: 24 mmol/L (ref 18–29)
Calcium: 9.4 mg/dL (ref 8.7–10.3)
Creatinine, Ser: 1.31 mg/dL — ABNORMAL HIGH (ref 0.57–1.00)
GFR calc non Af Amer: 35 mL/min/{1.73_m2} — ABNORMAL LOW (ref >59)
GFR, EST AFRICAN AMERICAN: 40 mL/min/{1.73_m2} — AB (ref >59)
GLUCOSE: 103 mg/dL — AB (ref 65–99)
Globulin, Total: 2.5 g/dL (ref 1.5–4.5)
POTASSIUM: 4.6 mmol/L (ref 3.5–5.2)
Sodium: 141 mmol/L (ref 134–144)
TOTAL PROTEIN: 6.6 g/dL (ref 6.0–8.5)

## 2016-06-29 LAB — T4: T4 TOTAL: 7.6 ug/dL (ref 4.5–12.0)

## 2016-07-01 ENCOUNTER — Telehealth: Payer: Self-pay | Admitting: Emergency Medicine

## 2016-07-01 MED ORDER — LEVOTHYROXINE SODIUM 50 MCG PO TABS: 50.0000 ug | ORAL_TABLET | Freq: Every day | ORAL | 3 refills | Status: DC

## 2016-07-01 NOTE — Telephone Encounter (Signed)
Pt caregiver informed. Med sent to pharmacy.

## 2016-07-01 NOTE — Telephone Encounter (Signed)
-----   Message from Jodell CiproDennis E Maryhillhrismon, [place] sent at 07/01/2016 11:31 AM EST ----- Blood tests shows she still has some impairment of renal function and TSH higher. Recommend increasing Levothyroxine to 50 mcg qd #30 & 3 RF then recheck levels in 1 month and look at swelling again.

## 2016-07-05 ENCOUNTER — Telehealth: Payer: Self-pay | Admitting: Family Medicine

## 2016-07-05 ENCOUNTER — Ambulatory Visit (INDEPENDENT_AMBULATORY_CARE_PROVIDER_SITE_OTHER): Payer: Medicare Other | Admitting: Podiatry

## 2016-07-05 ENCOUNTER — Ambulatory Visit: Payer: Medicare Other | Admitting: Podiatry

## 2016-07-05 ENCOUNTER — Encounter: Payer: Self-pay | Admitting: Podiatry

## 2016-07-05 DIAGNOSIS — M2170 Unequal limb length (acquired), unspecified site: Secondary | ICD-10-CM

## 2016-07-05 DIAGNOSIS — H401414 Capsular glaucoma with pseudoexfoliation of lens, right eye, indeterminate stage: Secondary | ICD-10-CM | POA: Diagnosis not present

## 2016-07-05 DIAGNOSIS — M1990 Unspecified osteoarthritis, unspecified site: Secondary | ICD-10-CM

## 2016-07-05 NOTE — Telephone Encounter (Signed)
Should give patient an extra tablet of Furosemide for 3 days (increase to two 40 mg tablets) and an extra tablet of Potassium for 3 days (increase to two 20 meq tablets) then recheck weight and potassium level in a week.

## 2016-07-05 NOTE — Progress Notes (Signed)
Subjective:  Patient presents today with her daughter for evaluation of a limb length discrepancy to the bilateral lower extremities. Patient presents today wearing shoes with an extrinsic built up sole to the left lower extremity. Patient has had the pair of shoes for several years now.  Patient states that she needs a new pair of shoes to compensate for her limb length discrepancy. Patient denies a history of trauma. She states that her limb length discrepancy is due to arthritic degeneration of her knee and hip left lower extremity.  Patient presents today for further treatment and evaluation    Objective/Physical Exam General: The patient is alert and oriented x3 in no acute distress.  Dermatology: Skin is warm, dry and supple bilateral lower extremities. Negative for open lesions or macerations.  Vascular: Palpable pedal pulses bilaterally. No edema or erythema noted. Capillary refill within normal limits.  Neurological: Epicritic and protective threshold grossly intact bilaterally.   Musculoskeletal Exam: Range of motion within normal limits to all pedal and ankle joints bilateral. Muscle strength 5/5 in all groups bilateral.  There is a limb length discrepancy noted with the left lower extremity approximately 4 cm shorter than the right lower extremity.   Radiographic Exam:  Normal osseous mineralization. Joint spaces preserved. No fracture/dislocation/boney destruction.    Assessment: #1 limb length discrepancy-left lower extremity 4 cm shorter than right lower extremity #2 osteoarthritis and degenerative joint disease left lower extremity  Plan of Care:  #1 Patient was evaluated. #2 today we discussed in detail the patient's past medical history is negative for fracture or trauma. #3 prescription provided today for [facility] for a new pair of shoes with an extrinsic sole to the left lower extremity to compensate the limb length discrepancy #4 return to clinic when  necessary   Felecia ShellingBrent M. Evans, DPM [facility]  Dr. Felecia ShellingBrent M. Evans, DPM   [address]                                        [place], [place] [address]                Office [phone]  Fax [phone]

## 2016-07-05 NOTE — Telephone Encounter (Signed)
[facility]. Patient scheduled for a follow up appointment. [facility] at [phone].

## 2016-07-05 NOTE — Telephone Encounter (Signed)
[facility] with [name] stated pt has gain 5 pounds over the last week and has swelling in lower legs. Brandy Schroeder would like a call back to advise if she should try something or change any of pt's medications. Please advise. Thanks TNP

## 2016-07-12 ENCOUNTER — Ambulatory Visit
Admission: RE | Admit: 2016-07-12 | Discharge: 2016-07-12 | Disposition: A | Discharge status: Home / Self Care | Payer: Medicare Other | Source: Ambulatory Visit | Attending: Family Medicine | Admitting: Family Medicine

## 2016-07-12 ENCOUNTER — Other Ambulatory Visit: Payer: Self-pay | Admitting: Family Medicine

## 2016-07-12 ENCOUNTER — Encounter: Payer: Self-pay | Admitting: Family Medicine

## 2016-07-12 ENCOUNTER — Ambulatory Visit (INDEPENDENT_AMBULATORY_CARE_PROVIDER_SITE_OTHER): Payer: Medicare Other | Admitting: Family Medicine

## 2016-07-12 VITALS — BP 130/60 | HR 64 | Resp 16 | Wt 177.4 lb

## 2016-07-12 DIAGNOSIS — R911 Solitary pulmonary nodule: Secondary | ICD-10-CM | POA: Diagnosis not present

## 2016-07-12 DIAGNOSIS — R609 Edema, unspecified: Secondary | ICD-10-CM | POA: Diagnosis not present

## 2016-07-12 DIAGNOSIS — E039 Hypothyroidism, unspecified: Secondary | ICD-10-CM

## 2016-07-12 DIAGNOSIS — R918 Other nonspecific abnormal finding of lung field: Secondary | ICD-10-CM | POA: Insufficient documentation

## 2016-07-12 DIAGNOSIS — N184 Chronic kidney disease, stage 4 (severe): Secondary | ICD-10-CM | POA: Diagnosis not present

## 2016-07-12 NOTE — Progress Notes (Signed)
Patient: Brandy Schroeder Female    DOB: 07/22/1919   82 y.o.   MRN: [id number] Visit Date: 07/12/2016  Today's Provider: Dortha Kernennis Damien Cisar, PA   Chief Complaint  Patient presents with  . Follow-up   Subjective:    HPI Patient is here to follow up on weight and potassium check. Report was called on 06/25/2016 that patient's weight had increase 5 pounds. [facility] was advised to increase patient's Lasix to 2 tablets each day for 3 days and increase Potassium to 2 tablets each day for 3 days. Patient/cargiver reports good compliance with treatment plan. Patient's unchanged after taking extra tablets for 3 days.    Past Medical History:  Diagnosis Date  . Anxiety   . Blind left eye   . CKD (chronic kidney disease)   . Depression   . Hypothyroidism   . OA (osteoarthritis)    Past Surgical History:  Procedure Laterality Date  . ABDOMINAL HYSTERECTOMY    . CHOLECYSTECTOMY    . EYE SURGERY     Family History  Problem Relation Age of Onset  . Arthritis Mother   . Stroke Mother   . Heart disease Brother   . Arthritis Brother    Allergies  Allergen Reactions  . Sulfa Antibiotics      Previous Medications   ACETAMINOPHEN (TYLENOL) 500 MG TABLET    Take 500 mg by mouth every 6 (six) hours as needed.   ALPHA-LIPOIC ACID 300 MG CAPS    TAKE (1) CAPSULE BY MOUTH ONCE DAILY.   AMLODIPINE (NORVASC) 10 MG TABLET    TAKE 1 TABLET BY MOUTH ONCE DAILY.   CHOLECALCIFEROL (VITAMIN D3) 2000 UNITS CAPSULE    TAKE (1) CAPSULE BY MOUTH ONCE DAILY.   ESTRADIOL (ESTRACE VAGINAL) 0.1 MG/GM VAGINAL CREAM    Place 1 Applicatorful vaginally at bedtime.   FUROSEMIDE (LASIX) 40 MG TABLET    TAKE 1 TABLET BY MOUTH ONCE DAILY.   LATANOPROST (XALATAN) 0.005 % OPHTHALMIC SOLUTION    1 drop at bedtime.   LEVOTHYROXINE (SYNTHROID, LEVOTHROID) 50 MCG TABLET    Take 1 tablet (50 mcg total) by mouth daily before breakfast.   POTASSIUM CHLORIDE SA (K-DUR,KLOR-CON) 20 MEQ TABLET     TAKE 1 TABLET BY MOUTH ONCE DAILY.   TRAZODONE (DESYREL) 150 MG TABLET    TAKE ONE TABLET BY MOUTH AT BEDTIME.    Review of Systems  Constitutional: Negative.   Respiratory: Negative.   Cardiovascular: Positive for leg swelling.    Social History  Substance Use Topics  . Smoking status: Never Smoker  . Smokeless tobacco: Never Used  . Alcohol use No   Objective:   BP 130/60 (BP Location: Right Arm, Patient Position: Sitting, Cuff Size: Normal)   Pulse 64   Resp 16   Wt 177 lb 6.4 oz (80.5 kg)   BMI 29.52 kg/m  Wt Readings from Last 3 Encounters:  07/12/16 177 lb 6.4 oz (80.5 kg)  06/28/16 173 lb 3.2 oz (78.6 kg)  05/31/16 167 lb 6.4 oz (75.9 kg)    Physical Exam  Constitutional: She is oriented to person, place, and time. She appears well-developed and well-nourished.  HENT:  Head: Normocephalic.  Neck: Neck supple. No thyromegaly present.  Cardiovascular: Normal rate and regular rhythm.   Pulmonary/Chest: Effort normal and breath sounds normal.  Musculoskeletal: She exhibits edema.  Both lower legs and feet with 3+ pitting edema. Difficult to palpate pulses. Large areas of ecchymosis on shins  bilaterally (no known injury).  Neurological: She is alert and oriented to person, place, and time.      Assessment & Plan:     1. Peripheral edema Despite use of Lasix 80 mg qd for 3 days, she has not lost any weight and edema still worsening. Denies dyspnea. Caregiver states she will walk a little indoors but will only go out of the home in a wheelchair. Will check labs and CXR for sign of CHF and go back to Lasix 40 mg qd and add Zaroxolyn 2.5 mg qd for 7 days. Caregiver will check weight daily and call report of progress in 7-10 days. Schedule follow up appointment in 3 weeks - CBC with Differential/Platelet - Comprehensive metabolic panel - DG Chest 2 View  2. Hypothyroidism, unspecified type Labs on 06-28-16 showed TSH very much higher at 15.360. Increased Levothyroxine  to 50 mcg qd and recheck CBC with CMP. No tremor, palpitation, tachycardia or heat intolerance.  - CBC with Differential/Platelet - Comprehensive metabolic panel  3. CKD (chronic kidney disease), stage IV (HCC) Creatinine on 06-28-16 was 1.31 and BUN 34 with potassium 4.6. Will continue potassium 20 meq one daily as she is using more  diuretics. Recheck CMP today - Comprehensive metabolic panel

## 2016-07-13 LAB — COMPREHENSIVE METABOLIC PANEL
ALBUMIN: 4.3 g/dL (ref 3.2–4.6)
ALK PHOS: 79 IU/L (ref 39–117)
ALT: 19 IU/L (ref 0–32)
AST: 19 IU/L (ref 0–40)
Albumin/Globulin Ratio: 1.7 (ref 1.2–2.2)
BILIRUBIN TOTAL: 0.2 mg/dL (ref 0.0–1.2)
BUN / CREAT RATIO: 28 (ref 12–28)
BUN: 35 mg/dL (ref 10–36)
CHLORIDE: 100 mmol/L (ref 96–106)
CO2: 24 mmol/L (ref 18–29)
Calcium: 9.5 mg/dL (ref 8.7–10.3)
Creatinine, Ser: 1.23 mg/dL — ABNORMAL HIGH (ref 0.57–1.00)
GFR calc Af Amer: 43 mL/min/{1.73_m2} — ABNORMAL LOW (ref >59)
GFR calc non Af Amer: 37 mL/min/{1.73_m2} — ABNORMAL LOW (ref >59)
GLOBULIN, TOTAL: 2.5 g/dL (ref 1.5–4.5)
Glucose: 102 mg/dL — ABNORMAL HIGH (ref 65–99)
Potassium: 5.3 mmol/L — ABNORMAL HIGH (ref 3.5–5.2)
SODIUM: 141 mmol/L (ref 134–144)
Total Protein: 6.8 g/dL (ref 6.0–8.5)

## 2016-07-13 LAB — CBC WITH DIFFERENTIAL/PLATELET
BASOS ABS: 0 10*3/uL (ref 0.0–0.2)
Basos: 0 %
EOS (ABSOLUTE): 0.1 10*3/uL (ref 0.0–0.4)
EOS: 1 %
HEMATOCRIT: 36 % (ref 34.0–46.6)
HEMOGLOBIN: 11.8 g/dL (ref 11.1–15.9)
Immature Grans (Abs): 0 10*3/uL (ref 0.0–0.1)
Immature Granulocytes: 1 %
LYMPHS ABS: 1.1 10*3/uL (ref 0.7–3.1)
Lymphs: 20 %
MCH: 30.3 pg (ref 26.6–33.0)
MCHC: 32.8 g/dL (ref 31.5–35.7)
MCV: 92 fL (ref 79–97)
MONOCYTES: 9 %
Monocytes Absolute: 0.5 10*3/uL (ref 0.1–0.9)
NEUTROS ABS: 3.7 10*3/uL (ref 1.4–7.0)
Neutrophils: 69 %
Platelets: 241 10*3/uL (ref 150–379)
RBC: 3.9 x10E6/uL (ref 3.77–5.28)
RDW: 14 % (ref 12.3–15.4)
WBC: 5.3 10*3/uL (ref 3.4–10.8)

## 2016-07-14 ENCOUNTER — Telehealth: Payer: Self-pay

## 2016-07-14 NOTE — Telephone Encounter (Signed)
-----   Message from Tamsen Roersennis E Chrismon, [place] sent at 07/13/2016 11:01 PM EST ----- Normal blood counts. Potassium normal - continue once a day. Kidney function improving from last check. Proceed with medications as directed and recheck as planned.

## 2016-07-14 NOTE — Telephone Encounter (Signed)
Patient's caregiver advised as below. Patient's caregiver Lethea Killingsmily verbalizes understanding and is in agreement with treatment plan.

## 2016-07-14 NOTE — Telephone Encounter (Signed)
Patient's caregiver Irving Burtonmily advised as below.

## 2016-07-14 NOTE — Telephone Encounter (Signed)
-----   Message from Tamsen Roersennis E Chrismon, [place] sent at 07/13/2016 11:03 PM EST ----- No sign of pulmonary edema. Proceed with diuretic for edema of lower legs. Recheck as planned.

## 2016-07-21 DIAGNOSIS — Z23 Encounter for immunization: Secondary | ICD-10-CM | POA: Diagnosis not present

## 2016-07-21 NOTE — Addendum Note (Signed)
Addended by: Sherre PootWALSTON, Metzli Pollick N on: 07/21/2016 03:38 PM   Modules accepted: Orders

## 2016-07-26 ENCOUNTER — Telehealth: Payer: Self-pay | Admitting: Family Medicine

## 2016-07-26 DIAGNOSIS — I872 Venous insufficiency (chronic) (peripheral): Secondary | ICD-10-CM

## 2016-07-26 NOTE — Telephone Encounter (Signed)
Schedule Home Health wound care.

## 2016-07-26 NOTE — Telephone Encounter (Signed)
[name] states pt has a place on her right leg that is not healing and continues to burst open and bleed.  She is requesting a written order to have home health come in to do wound care.  CB# and Fax #[phone]/MW

## 2016-07-26 NOTE — Telephone Encounter (Signed)
Please advise. Ceola Para Drozdowski, CMA  

## 2016-07-26 NOTE — Telephone Encounter (Signed)
Ordered. Brandy Schroeder, CMA  

## 2016-07-28 ENCOUNTER — Telehealth: Payer: Self-pay | Admitting: Family Medicine

## 2016-07-28 NOTE — Telephone Encounter (Signed)
[facility]a [name] wanted to make sure that [name] is aware that pt's weight continues to increase and her current weight is 169. Thanks TNP

## 2016-07-28 NOTE — Telephone Encounter (Signed)
[facility] this 169 lbs is much better than the 177 lbs she was in the office on 07-12-16. Keep appointment on 08-02-16 for recheck.

## 2016-07-28 NOTE — Telephone Encounter (Signed)
Margarito CourserAdvised Tonya at [facility].

## 2016-07-29 DIAGNOSIS — Z4800 Encounter for change or removal of nonsurgical wound dressing: Secondary | ICD-10-CM | POA: Diagnosis not present

## 2016-07-29 DIAGNOSIS — I872 Venous insufficiency (chronic) (peripheral): Secondary | ICD-10-CM | POA: Diagnosis not present

## 2016-07-29 DIAGNOSIS — N189 Chronic kidney disease, unspecified: Secondary | ICD-10-CM | POA: Diagnosis not present

## 2016-08-01 DIAGNOSIS — Z4800 Encounter for change or removal of nonsurgical wound dressing: Secondary | ICD-10-CM | POA: Diagnosis not present

## 2016-08-01 DIAGNOSIS — I872 Venous insufficiency (chronic) (peripheral): Secondary | ICD-10-CM | POA: Diagnosis not present

## 2016-08-01 DIAGNOSIS — N189 Chronic kidney disease, unspecified: Secondary | ICD-10-CM | POA: Diagnosis not present

## 2016-08-02 ENCOUNTER — Ambulatory Visit: Payer: Medicare Other | Admitting: Family Medicine

## 2016-08-02 DIAGNOSIS — H5319 Other subjective visual disturbances: Secondary | ICD-10-CM | POA: Diagnosis not present

## 2016-08-02 NOTE — Progress Notes (Deleted)
   Patient: Brandy JoyChristine M Mennella Female    DOB: 02/07/1919   83 y.o.   MRN: [id number] Visit Date: 08/02/2016  Today's Provider: Dortha Kernennis Chrismon, PA   No chief complaint on file.  Subjective:    HPI Patient is here for a 3 week follow up for weight check. Patient advised to start back on Lasix 40 mg daily and added Zaroxolyn 2.5 mg daily for 7 days. Caregiver has been checking patient's weight daily which is      Previous Medications   ACETAMINOPHEN (TYLENOL) 500 MG TABLET    Take 500 mg by mouth every 6 (six) hours as needed.   ALPHA-LIPOIC ACID 300 MG CAPS    TAKE (1) CAPSULE BY MOUTH ONCE DAILY.   AMLODIPINE (NORVASC) 10 MG TABLET    TAKE 1 TABLET BY MOUTH ONCE DAILY.   CHOLECALCIFEROL (VITAMIN D3) 2000 UNITS CAPSULE    TAKE (1) CAPSULE BY MOUTH ONCE DAILY.   ESTRADIOL (ESTRACE VAGINAL) 0.1 MG/GM VAGINAL CREAM    Place 1 Applicatorful vaginally at bedtime.   FUROSEMIDE (LASIX) 40 MG TABLET    TAKE 1 TABLET BY MOUTH ONCE DAILY.   LATANOPROST (XALATAN) 0.005 % OPHTHALMIC SOLUTION    1 drop at bedtime.   LEVOTHYROXINE (SYNTHROID, LEVOTHROID) 50 MCG TABLET    Take 1 tablet (50 mcg total) by mouth daily before breakfast.   POTASSIUM CHLORIDE SA (K-DUR,KLOR-CON) 20 MEQ TABLET    TAKE 1 TABLET BY MOUTH ONCE DAILY.   TRAZODONE (DESYREL) 150 MG TABLET    TAKE ONE TABLET BY MOUTH AT BEDTIME.    Review of Systems  Constitutional: Negative.   Respiratory: Negative.   Cardiovascular: Positive for leg swelling.    Social History  Substance Use Topics  . Smoking status: Never Smoker  . Smokeless tobacco: Never Used  . Alcohol use No   Objective:   There were no vitals taken for this visit.  Physical Exam      Assessment & Plan:       Follow up: No Follow-up on file.

## 2016-08-04 DIAGNOSIS — I872 Venous insufficiency (chronic) (peripheral): Secondary | ICD-10-CM | POA: Diagnosis not present

## 2016-08-04 DIAGNOSIS — N189 Chronic kidney disease, unspecified: Secondary | ICD-10-CM | POA: Diagnosis not present

## 2016-08-04 DIAGNOSIS — Z4800 Encounter for change or removal of nonsurgical wound dressing: Secondary | ICD-10-CM | POA: Diagnosis not present

## 2016-08-08 ENCOUNTER — Other Ambulatory Visit: Payer: Self-pay | Admitting: Family Medicine

## 2016-08-09 ENCOUNTER — Telehealth: Payer: Self-pay | Admitting: Family Medicine

## 2016-08-09 DIAGNOSIS — N189 Chronic kidney disease, unspecified: Secondary | ICD-10-CM | POA: Diagnosis not present

## 2016-08-09 DIAGNOSIS — I872 Venous insufficiency (chronic) (peripheral): Secondary | ICD-10-CM | POA: Diagnosis not present

## 2016-08-09 DIAGNOSIS — Z4800 Encounter for change or removal of nonsurgical wound dressing: Secondary | ICD-10-CM | POA: Diagnosis not present

## 2016-08-09 NOTE — Telephone Encounter (Signed)
Note placed on your desk to sign thanks!

## 2016-08-09 NOTE — Telephone Encounter (Signed)
Please review. Thanks!  

## 2016-08-09 NOTE — Telephone Encounter (Signed)
Pt is with home care.  They are wanting to know if it is okay to cx appt for office weight recheck and potassium recheck.  They are wanting home health to do the weight ck, potassium check and urine check.  [facility] [phone].

## 2016-08-09 NOTE — Telephone Encounter (Signed)
Schedule home health check of weight and get urinalysis with blood draw for potassium level. May postpone the appointment scheduled for 08-15-16 until mid February.

## 2016-08-12 DIAGNOSIS — N189 Chronic kidney disease, unspecified: Secondary | ICD-10-CM | POA: Diagnosis not present

## 2016-08-12 DIAGNOSIS — I872 Venous insufficiency (chronic) (peripheral): Secondary | ICD-10-CM | POA: Diagnosis not present

## 2016-08-12 DIAGNOSIS — Z4800 Encounter for change or removal of nonsurgical wound dressing: Secondary | ICD-10-CM | POA: Diagnosis not present

## 2016-08-12 DIAGNOSIS — R8290 Unspecified abnormal findings in urine: Secondary | ICD-10-CM | POA: Diagnosis not present

## 2016-08-15 ENCOUNTER — Ambulatory Visit: Payer: Medicare Other | Admitting: Family Medicine

## 2016-08-15 DIAGNOSIS — Z4800 Encounter for change or removal of nonsurgical wound dressing: Secondary | ICD-10-CM | POA: Diagnosis not present

## 2016-08-15 DIAGNOSIS — N189 Chronic kidney disease, unspecified: Secondary | ICD-10-CM | POA: Diagnosis not present

## 2016-08-15 DIAGNOSIS — I872 Venous insufficiency (chronic) (peripheral): Secondary | ICD-10-CM | POA: Diagnosis not present

## 2016-08-17 ENCOUNTER — Telehealth: Payer: Self-pay | Admitting: Family Medicine

## 2016-08-17 NOTE — Telephone Encounter (Signed)
Please review. Thanks!  

## 2016-08-17 NOTE — Telephone Encounter (Signed)
[facility] with [name] called to request results from urine culture and blood test.  NW#[phone]/VHCB#[phone]/MW

## 2016-08-17 NOTE — Telephone Encounter (Signed)
She will need to call the home health nurse that was supposed to get the urinalysis and potassium check done. Have not received any reports and not sure which home health company was to see her.

## 2016-08-18 DIAGNOSIS — Z4800 Encounter for change or removal of nonsurgical wound dressing: Secondary | ICD-10-CM | POA: Diagnosis not present

## 2016-08-18 DIAGNOSIS — I872 Venous insufficiency (chronic) (peripheral): Secondary | ICD-10-CM | POA: Diagnosis not present

## 2016-08-18 DIAGNOSIS — N189 Chronic kidney disease, unspecified: Secondary | ICD-10-CM | POA: Diagnosis not present

## 2016-08-19 NOTE — Telephone Encounter (Signed)
Brandy Schroeder was not there and another tech was advised of below-aa

## 2016-08-22 DIAGNOSIS — I872 Venous insufficiency (chronic) (peripheral): Secondary | ICD-10-CM | POA: Diagnosis not present

## 2016-08-22 DIAGNOSIS — N189 Chronic kidney disease, unspecified: Secondary | ICD-10-CM | POA: Diagnosis not present

## 2016-08-22 DIAGNOSIS — Z4800 Encounter for change or removal of nonsurgical wound dressing: Secondary | ICD-10-CM | POA: Diagnosis not present

## 2016-08-24 ENCOUNTER — Encounter: Payer: Self-pay | Admitting: Family Medicine

## 2016-08-25 DIAGNOSIS — N189 Chronic kidney disease, unspecified: Secondary | ICD-10-CM | POA: Diagnosis not present

## 2016-08-25 DIAGNOSIS — I872 Venous insufficiency (chronic) (peripheral): Secondary | ICD-10-CM | POA: Diagnosis not present

## 2016-08-25 DIAGNOSIS — Z4800 Encounter for change or removal of nonsurgical wound dressing: Secondary | ICD-10-CM | POA: Diagnosis not present

## 2016-08-29 DIAGNOSIS — N189 Chronic kidney disease, unspecified: Secondary | ICD-10-CM | POA: Diagnosis not present

## 2016-08-29 DIAGNOSIS — Z4800 Encounter for change or removal of nonsurgical wound dressing: Secondary | ICD-10-CM | POA: Diagnosis not present

## 2016-08-29 DIAGNOSIS — I872 Venous insufficiency (chronic) (peripheral): Secondary | ICD-10-CM | POA: Diagnosis not present

## 2016-09-01 DIAGNOSIS — Z4800 Encounter for change or removal of nonsurgical wound dressing: Secondary | ICD-10-CM | POA: Diagnosis not present

## 2016-09-01 DIAGNOSIS — N189 Chronic kidney disease, unspecified: Secondary | ICD-10-CM | POA: Diagnosis not present

## 2016-09-01 DIAGNOSIS — I872 Venous insufficiency (chronic) (peripheral): Secondary | ICD-10-CM | POA: Diagnosis not present

## 2016-09-05 DIAGNOSIS — I872 Venous insufficiency (chronic) (peripheral): Secondary | ICD-10-CM | POA: Diagnosis not present

## 2016-09-05 DIAGNOSIS — Z4800 Encounter for change or removal of nonsurgical wound dressing: Secondary | ICD-10-CM | POA: Diagnosis not present

## 2016-09-05 DIAGNOSIS — N189 Chronic kidney disease, unspecified: Secondary | ICD-10-CM | POA: Diagnosis not present

## 2016-09-08 ENCOUNTER — Other Ambulatory Visit: Payer: Self-pay | Admitting: Family Medicine

## 2016-09-08 DIAGNOSIS — R609 Edema, unspecified: Secondary | ICD-10-CM

## 2016-09-09 ENCOUNTER — Telehealth: Payer: Self-pay | Admitting: Family Medicine

## 2016-09-09 NOTE — Telephone Encounter (Signed)
[facility] and spoke with [name]. Pt is now scheduled for F/U on Monday 10/10/16. Thanks TNP

## 2016-09-09 NOTE — Telephone Encounter (Signed)
Recommend follow up in March. Please schedule.

## 2016-09-09 NOTE — Telephone Encounter (Signed)
[facility] wants to know if Brandy Schroeder needs a fu appt.  Please advise.  Thanks Barth Kirkseri

## 2016-09-14 DIAGNOSIS — H401414 Capsular glaucoma with pseudoexfoliation of lens, right eye, indeterminate stage: Secondary | ICD-10-CM | POA: Diagnosis not present

## 2016-09-29 ENCOUNTER — Telehealth: Payer: Self-pay

## 2016-09-29 NOTE — Telephone Encounter (Signed)
Babby from Home Health called to inform you she will be re-faxing plan of care. Was originally faxed on 08/05/2016, and she did not receive response. FYI. Allene DillonEmily Drozdowski, CMA

## 2016-09-30 NOTE — Telephone Encounter (Signed)
fyi-aa 

## 2016-10-10 ENCOUNTER — Ambulatory Visit: Payer: Medicare Other | Admitting: Family Medicine

## 2016-10-11 ENCOUNTER — Ambulatory Visit (INDEPENDENT_AMBULATORY_CARE_PROVIDER_SITE_OTHER): Payer: Medicare Other | Admitting: Family Medicine

## 2016-10-11 ENCOUNTER — Encounter: Payer: Self-pay | Admitting: Family Medicine

## 2016-10-11 ENCOUNTER — Ambulatory Visit (INDEPENDENT_AMBULATORY_CARE_PROVIDER_SITE_OTHER): Payer: Medicare Other

## 2016-10-11 VITALS — BP 136/62 | HR 76 | Temp 99.1°F | Wt 175.4 lb

## 2016-10-11 VITALS — BP 136/62 | HR 76 | Temp 99.1°F | Ht 65.0 in | Wt 175.4 lb

## 2016-10-11 DIAGNOSIS — E039 Hypothyroidism, unspecified: Secondary | ICD-10-CM | POA: Diagnosis not present

## 2016-10-11 DIAGNOSIS — I10 Essential (primary) hypertension: Secondary | ICD-10-CM | POA: Diagnosis not present

## 2016-10-11 DIAGNOSIS — Z0000 Encounter for general adult medical examination without abnormal findings: Secondary | ICD-10-CM

## 2016-10-11 DIAGNOSIS — R609 Edema, unspecified: Secondary | ICD-10-CM | POA: Diagnosis not present

## 2016-10-11 DIAGNOSIS — H5440 Blindness, one eye, unspecified eye: Secondary | ICD-10-CM

## 2016-10-11 DIAGNOSIS — M5137 Other intervertebral disc degeneration, lumbosacral region: Secondary | ICD-10-CM | POA: Diagnosis not present

## 2016-10-11 DIAGNOSIS — N184 Chronic kidney disease, stage 4 (severe): Secondary | ICD-10-CM | POA: Diagnosis not present

## 2016-10-11 LAB — POCT URINALYSIS DIPSTICK
BILIRUBIN UA: NEGATIVE
Blood, UA: NEGATIVE
Glucose, UA: NEGATIVE
Ketones, UA: NEGATIVE
NITRITE UA: POSITIVE
PH UA: 7.5 (ref 5.0–8.0)
PROTEIN UA: NEGATIVE
Spec Grav, UA: 1.01 (ref 1.030–1.035)
Urobilinogen, UA: 0.2 (ref ?–2.0)

## 2016-10-11 NOTE — Progress Notes (Signed)
Patient: Brandy Schroeder Female    DOB: 1919-03-07   83 y.o.   MRN: [id number] Visit Date: 10/11/2016  Today's Provider: Dortha Kern, PA   Chief Complaint  Patient presents with  . Hypothyroidism  . Follow-up   Subjective:    HPI  Patient is here to follow up from AWE done this morning. Patient's only concern is leg swelling. Her weight has been fluctuating between 165-169 lb.    Hypothyroid, follow-up:  TSH  Date Value Ref Range Status  06/28/2016 15.360 (H) 0.450 - 4.500 uIU/mL Final  02/12/2016 8.960 (H) 0.450 - 4.500 uIU/mL Final  01/06/2016 11.130 (H) 0.450 - 4.500 uIU/mL Final   Wt Readings from Last 3 Encounters:  10/11/16 175 lb 6.4 oz (79.6 kg)  10/11/16 175 lb 6.4 oz (79.6 kg)  07/12/16 177 lb 6.4 oz (80.5 kg)    She was last seen for hypothyroid 3 months ago.  Management since that visit includes increase Levothyroxine to 50 mcg. TSH was higher. She reports good compliance with treatment. She is not having side effects.  She is not exercising. She is experiencing weight changes She denies change in energy level, diarrhea, heat / cold intolerance, nervousness and palpitations Weight trend: fluctuating a bit  ------------------------------------------------------------------------ Past Medical History:  Diagnosis Date  . Anxiety   . Blind left eye   . CKD (chronic kidney disease)   . Depression   . Hypothyroidism   . OA (osteoarthritis)    Past Surgical History:  Procedure Laterality Date  . ABDOMINAL HYSTERECTOMY    . CHOLECYSTECTOMY    . EYE SURGERY     Family History  Problem Relation Age of Onset  . Arthritis Mother   . Stroke Mother   . Heart disease Brother   . Arthritis Brother    Allergies  Allergen Reactions  . Sulfa Antibiotics      Previous Medications   ACETAMINOPHEN (TYLENOL) 500 MG TABLET    Take 500 mg by mouth every 6 (six) hours as needed.   ALPHA-LIPOIC ACID 300 MG CAPS    TAKE (1) CAPSULE BY MOUTH ONCE DAILY.   ALPRAZOLAM (XANAX) 0.25 MG TABLET    Take 0.25 mg by mouth 2 (two) times daily as needed for anxiety.   AMLODIPINE (NORVASC) 10 MG TABLET    TAKE 1 TABLET BY MOUTH ONCE DAILY.   CHOLECALCIFEROL (VITAMIN D3) 2000 UNITS CAPSULE    TAKE (1) CAPSULE BY MOUTH ONCE DAILY.   FUROSEMIDE (LASIX) 40 MG TABLET    TAKE 1 TABLET BY MOUTH TWICE DAILY (MORNING AND LUNCH)   LATANOPROST (XALATAN) 0.005 % OPHTHALMIC SOLUTION    Place 1 drop into the right eye at bedtime.    LEVOTHYROXINE (SYNTHROID, LEVOTHROID) 50 MCG TABLET    Take 1 tablet (50 mcg total) by mouth daily before breakfast.   POTASSIUM CHLORIDE SA (K-DUR,KLOR-CON) 20 MEQ TABLET    TAKE 1 TABLET BY MOUTH ONCE DAILY.   TRAZODONE (DESYREL) 150 MG TABLET    TAKE ONE TABLET BY MOUTH AT BEDTIME.    Review of Systems  Constitutional: Negative.   Respiratory: Negative.   Cardiovascular: Positive for leg swelling.    Social History  Substance Use Topics  . Smoking status: Never Smoker  . Smokeless tobacco: Never Used  . Alcohol use No   Objective:   BP 136/62   Pulse 76   Temp 99.1 F (37.3 C) (Oral)   Wt 175 lb 6.4 oz (79.6 kg)   BMI 29.19 kg/m  Physical Exam  Constitutional: She is oriented to person, place, and time. She appears well-developed and well-nourished. No distress.  HENT:  Head: Normocephalic and atraumatic.  Right Ear: Hearing normal.  Left Ear: Hearing normal.  Nose: Nose normal.  Mouth/Throat: Oropharynx is clear and moist.  Hearing loss moderate - refuses to wear hearing aid.  Eyes: Conjunctivae and lids are normal. Right eye exhibits no discharge. Left eye exhibits no discharge. No scleral icterus.  Blind in left eye secondary to macular degeneration.  Neck: Neck supple. No thyromegaly present.  Cardiovascular: Normal rate, regular rhythm and normal heart sounds.   Pulmonary/Chest: Effort normal and breath sounds normal. No respiratory distress.  Abdominal: Soft. Bowel sounds are normal.  Musculoskeletal:    History of DJD left hip with past fracture and left leg shorter than right by 2". Unable to cross left leg over the right. Pulses are symmetric. Mild edema both lower legs above ankles. No pitting. Wearing elevated shoe on the left foot. Stiffness and pains in the right shoulder to reach out or above shoulder level.  Neurological: She is alert and oriented to person, place, and time.  Moderate hearing loss but oriented. Fair extremity strength but poor balance and discomfort due to degenerative disease.  Skin: Skin is intact. No lesion and no rash noted.  Psychiatric: She has a normal mood and affect. Her speech is normal and behavior is normal. Thought content normal.      Assessment & Plan:     1. Adult hypothyroidism Feeling well without palpitations or tremors. No anxiety symptoms and has stopped using the Alprazolam (discontinued today). Last TSH 15,360 on 06-28-16. Medicare wellness screening results reviewed. Refused Tetanus immunization today. Will research records for last pneumonia vaccination, but, at her extreme age it may not be necessary. Recheck labs and follow up pending reports. Continue Levothyroxine 50 mcg qd. Completed FL-2 form today. - CBC with Differential/Platelet - Comprehensive metabolic panel - T4 - TSH  2. CKD (chronic kidney disease), stage IV (HCC) States she feels well today. Creatinine was 1.23 with K+ 5.3 on 07-12-16. Still using Furosemide 40 mg BID and KCL 20 meq qd. Has been drinking fluids better. No urinary tract discomfort. Still has some incontinence and wearing Depends diapers. Urinalysis shows some contamination. Will check CBC and CMP. - POCT Urinalysis Dipstick - CBC with Differential/Platelet - Comprehensive metabolic panel  3. Essential hypertension Stable and well controlled with use of Amlodipine10 mg qd. No chest pains or palpitations. Recheck CBC and CMP. - CBC with Differential/Platelet - Comprehensive metabolic panel  4. Degeneration of  lumbar or lumbosacral intervertebral disc Continues to have back, hip and right shoulder pains. Uses Tylenol prn for relief and a walker at the rest home to ambulate indoors. Presents in a wheelchair today.  5. Blind left eye History of macular degeneration with long term blindness in the left eye.  6. Edema, peripheral Mild swelling but stable weight. Uses Furosemide 40 mg BID with KCL 20 meq qd. No dyspnea today. Will check CMP and follow up pending reports. Continue no added salt diet. - Comprehensive metabolic panel

## 2016-10-11 NOTE — Patient Instructions (Signed)

## 2016-10-11 NOTE — Progress Notes (Signed)
Subjective:   Brandy Schroeder is a 83 y.o. female who presents for Medicare Annual (Subsequent) preventive examination.  Review of Systems: N/A  Cardiac Risk Factors include: advanced age (>43men, >32 women);hypertension     Objective:     Vitals: BP 136/62 (BP Location: Right Arm)   Pulse 76   Temp 99.1 F (37.3 C) (Oral)   Ht 5\' 5"  (1.651 m)   Wt 175 lb 6.4 oz (79.6 kg)   BMI 29.19 kg/m   Body mass index is 29.19 kg/m.   Tobacco History  Smoking Status  . Never Smoker  Smokeless Tobacco  . Never Used     Counseling given: Not Answered   Past Medical History:  Diagnosis Date  . Anxiety   . Blind left eye   . CKD (chronic kidney disease)   . Depression   . Hypothyroidism   . OA (osteoarthritis)    Past Surgical History:  Procedure Laterality Date  . ABDOMINAL HYSTERECTOMY    . CHOLECYSTECTOMY    . EYE SURGERY     Family History  Problem Relation Age of Onset  . Arthritis Mother   . Stroke Mother   . Heart disease Brother   . Arthritis Brother    History  Sexual Activity  . Sexual activity: Not on file    Outpatient Encounter Prescriptions as of 10/11/2016  Medication Sig  . acetaminophen (TYLENOL) 500 MG tablet Take 500 mg by mouth every 6 (six) hours as needed.  . Alpha-Lipoic Acid 300 MG CAPS TAKE (1) CAPSULE BY MOUTH ONCE DAILY.  Marland Kitchen ALPRAZolam (XANAX) 0.25 MG tablet Take 0.25 mg by mouth 2 (two) times daily as needed for anxiety.  Marland Kitchen amLODipine (NORVASC) 10 MG tablet TAKE 1 TABLET BY MOUTH ONCE DAILY.  Marland Kitchen Cholecalciferol (VITAMIN D3) 2000 units capsule TAKE (1) CAPSULE BY MOUTH ONCE DAILY.  . furosemide (LASIX) 40 MG tablet TAKE 1 TABLET BY MOUTH TWICE DAILY (MORNING AND LUNCH)  . latanoprost (XALATAN) 0.005 % ophthalmic solution Place 1 drop into the right eye at bedtime.   Marland Kitchen levothyroxine (SYNTHROID, LEVOTHROID) 50 MCG tablet Take 1 tablet (50 mcg total) by mouth daily before breakfast.  . potassium chloride SA (K-DUR,KLOR-CON) 20 MEQ  tablet TAKE 1 TABLET BY MOUTH ONCE DAILY.  . traZODone (DESYREL) 150 MG tablet TAKE ONE TABLET BY MOUTH AT BEDTIME.  Marland Kitchen estradiol (ESTRACE VAGINAL) 0.1 MG/GM vaginal cream Place 1 Applicatorful vaginally at bedtime. (Patient not taking: Reported on 10/11/2016)   No facility-administered encounter medications on file as of 10/11/2016.     Activities of Daily Living In your present state of health, do you have any difficulty performing the following activities: 10/11/2016  Hearing? Y  Vision? Y  Difficulty concentrating or making decisions? Y  Walking or climbing stairs? Y  Dressing or bathing? Y  Doing errands, shopping? Y  Preparing Food and eating ? Y  Using the Toilet? Y  In the past six months, have you accidently leaked urine? Y  Do you have problems with loss of bowel control? Y  Managing your Medications? Y  Managing your Finances? Y  Housekeeping or managing your Housekeeping? Y  Some recent data might be hidden    Patient Care Team: Tamsen Roers, PA as PCP - General (Physician Assistant)    Assessment:   Exercise Activities and Dietary recommendations Current Exercise Habits: Home exercise routine, Type of exercise: stretching;Other - see comments (chair exercises), Time (Minutes): 25, Frequency (Times/Week): 7, Weekly Exercise (Minutes/Week): 175,  Intensity: Mild, Exercise limited by: orthopedic condition(s)  Goals    . Increase water intake          Recommend to continue drinking 5 glasses of water a day.      Fall Risk Fall Risk  10/11/2016 01/05/2015  Falls in the past year? No No   Depression Screen PHQ 2/9 Scores 10/11/2016  PHQ - 2 Score 1     Cognitive Function     6CIT Screen 10/11/2016  What Year? 0 points  What month? 0 points  What time? 0 points  Count back from 20 0 points  Months in reverse 4 points  Repeat phrase 6 points  Total Score 10    Immunization History  Administered Date(s) Administered  . Influenza, High Dose Seasonal PF  07/21/2016  . Influenza-Unspecified 06/20/2006   Screening Tests Health Maintenance  Topic Date Due  . DEXA SCAN  09/22/2017 (Originally [birth year]1986)  . TETANUS/TDAP  07/25/2026 (Originally 02/24/1938)  . PNA vac Low Risk Adult (1 of 2 - PCV13) 07/25/2026 (Originally [birth year]1986)  . INFLUENZA VACCINE  Completed      Plan:  I have personally reviewed and addressed the Medicare Annual Wellness questionnaire and have noted the following in the patient's chart:  A. Medical and social history B. Use of alcohol, tobacco or illicit drugs  C. Current medications and supplements D. Functional ability and status E.  Nutritional status F.  Physical activity G. Advance directives H. List of other physicians I.  Hospitalizations, surgeries, and ER visits in previous 12 months J.  Vitals K. Screenings such as hearing and vision if needed, cognitive and depression L. Referrals and appointments - none  In addition, I have reviewed and discussed with patient certain preventive protocols, quality metrics, and best practice recommendations. A written personalized care plan for preventive services as well as general preventive health recommendations were provided to patient.  See attached scanned questionnaire for additional information.   Signed,  Hyacinth MeekerMckenzie Markoski, LPN Nurse Health Advisor   MD Recommendations: None. Caregiver declined tetanus and DEXA scan today. Will check records to see if pt received pneumonia vaccines in the past.   Reviewed the Health Advisor's note and was available for consultation. Agree with documentation and plan.

## 2016-10-12 LAB — T4: T4 TOTAL: 8.1 ug/dL (ref 4.5–12.0)

## 2016-10-12 LAB — COMPREHENSIVE METABOLIC PANEL
ALT: 11 IU/L (ref 0–32)
AST: 15 IU/L (ref 0–40)
Albumin/Globulin Ratio: 1.4 (ref 1.2–2.2)
Albumin: 4 g/dL (ref 3.2–4.6)
Alkaline Phosphatase: 87 IU/L (ref 39–117)
BILIRUBIN TOTAL: 0.3 mg/dL (ref 0.0–1.2)
BUN / CREAT RATIO: 20 (ref 12–28)
BUN: 33 mg/dL (ref 10–36)
CALCIUM: 9.3 mg/dL (ref 8.7–10.3)
CHLORIDE: 100 mmol/L (ref 96–106)
CO2: 26 mmol/L (ref 18–29)
CREATININE: 1.61 mg/dL — AB (ref 0.57–1.00)
GFR calc Af Amer: 31 mL/min/{1.73_m2} — ABNORMAL LOW (ref >59)
GFR calc non Af Amer: 27 mL/min/{1.73_m2} — ABNORMAL LOW (ref >59)
GLUCOSE: 100 mg/dL — AB (ref 65–99)
Globulin, Total: 2.9 g/dL (ref 1.5–4.5)
Potassium: 4.5 mmol/L (ref 3.5–5.2)
SODIUM: 144 mmol/L (ref 134–144)
TOTAL PROTEIN: 6.9 g/dL (ref 6.0–8.5)

## 2016-10-12 LAB — CBC WITH DIFFERENTIAL/PLATELET
Basophils Absolute: 0 10*3/uL (ref 0.0–0.2)
Basos: 0 %
EOS (ABSOLUTE): 0.1 10*3/uL (ref 0.0–0.4)
Eos: 1 %
Hematocrit: 39 % (ref 34.0–46.6)
Hemoglobin: 12.8 g/dL (ref 11.1–15.9)
Immature Grans (Abs): 0 10*3/uL (ref 0.0–0.1)
Immature Granulocytes: 0 %
Lymphocytes Absolute: 1.4 10*3/uL (ref 0.7–3.1)
Lymphs: 16 %
MCH: 29.8 pg (ref 26.6–33.0)
MCHC: 32.8 g/dL (ref 31.5–35.7)
MCV: 91 fL (ref 79–97)
Monocytes Absolute: 0.9 10*3/uL (ref 0.1–0.9)
Monocytes: 10 %
Neutrophils Absolute: 6.2 10*3/uL (ref 1.4–7.0)
Neutrophils: 73 %
Platelets: 216 10*3/uL (ref 150–379)
RBC: 4.29 x10E6/uL (ref 3.77–5.28)
RDW: 14.4 % (ref 12.3–15.4)
WBC: 8.5 10*3/uL (ref 3.4–10.8)

## 2016-10-12 LAB — TSH: TSH: 9.1 u[IU]/mL — AB (ref 0.450–4.500)

## 2016-10-13 ENCOUNTER — Telehealth: Payer: Self-pay

## 2016-10-13 NOTE — Telephone Encounter (Signed)
-----   Message from Tamsen Roersennis E Chrismon, [place] sent at 10/13/2016  8:52 AM EDT ----- Thyroid tests better. Continue present dosage of the Levothyroxine. No sign of infection or anemia. Potassium in the middle of normal range. Kidneys still show some significant stress. Continue fluid intake and use of diuretics as prescribed. Recheck progress in 3 months.

## 2016-10-13 NOTE — Telephone Encounter (Signed)
Spoke with [name] RibliBritta Mccreedyn from [facility] and advised as directed below. Scheduled f/u appointment on June 12.  Thanks,  -Joseline

## 2016-11-02 ENCOUNTER — Telehealth: Payer: Self-pay | Admitting: Family Medicine

## 2016-11-02 ENCOUNTER — Other Ambulatory Visit: Payer: Self-pay | Admitting: Family Medicine

## 2016-11-02 DIAGNOSIS — R609 Edema, unspecified: Secondary | ICD-10-CM

## 2016-11-02 NOTE — Telephone Encounter (Signed)
Babby with [facility] called needing an office note to go along with the face to face.  She said they faxed an order to you but needs office notes also.     Fax [phone]  Thank sTeri

## 2016-11-02 NOTE — Telephone Encounter (Signed)
[facility] faxed a request for the following medication.  Thanks CC  levothyroxine (SYNTHROID, LEVOTHROID) 50 MCG tablet  Take 1 tablet by mouth once daily before breakfast.

## 2016-11-03 MED ORDER — LEVOTHYROXINE SODIUM 50 MCG PO TABS: 50.0000 ug | ORAL_TABLET | Freq: Every day | ORAL | 3 refills | Status: DC

## 2016-11-03 NOTE — Telephone Encounter (Signed)
Need dates of notes needed. Has a follow up appointment scheduled for 01-03-17.

## 2016-11-03 NOTE — Telephone Encounter (Signed)
There is a [facility] that came out to [facility].  They are needing the last office notes from [place] regarding Mrs. Wrights wounds.  This is to go with the face to face visit.  They need it faxed to [phone]  Their phone number is [phone]  Barth Kirks

## 2016-11-03 NOTE — Telephone Encounter (Signed)
No contact number for message.

## 2016-11-03 NOTE — Telephone Encounter (Signed)
Contacted Babby with Home Health. She was requesting a OV notes that has documentation of home care 30 day before or 30 days after patient received wound care in January 2018.   OV note on 07/12/16 and 09/23/2016 reviewed neither has anything documented regarding home care. Referral was requested through telephone encounter.   Babby advised and states that is okay they will make do without the OV note.

## 2016-12-05 ENCOUNTER — Telehealth: Payer: Self-pay | Admitting: Family Medicine

## 2016-12-05 NOTE — Telephone Encounter (Signed)
[name] called saying they need a crush order on pt's trazadone.  She is having a difficulty swallowing.  [facility] [place].    [name]'s call back [phone]  Thanks, Barth Kirkseri

## 2016-12-05 NOTE — Telephone Encounter (Signed)
Please review. Thanks!  

## 2016-12-05 NOTE — Telephone Encounter (Signed)
May break 150 mg Trazodone tablets in half and take both halves, if having difficulty swallowing. Don't think we can crush them. Check with the pharmacist to be sure about crushing tablet.

## 2016-12-05 NOTE — Telephone Encounter (Signed)
Maurine Ministerennis, I called the pharmacy [facility]([place] [facility]) and they said that we may crush Trazodone. Would you like the patient to still take the half pills or take them crushed? Please advise. Thanks!

## 2016-12-05 NOTE — Telephone Encounter (Signed)
May crush into apple sauce or pudding to help facilitate swallowing the Trazodone.

## 2016-12-14 NOTE — Telephone Encounter (Signed)
Order was faxed stating below.

## 2017-01-03 ENCOUNTER — Ambulatory Visit: Payer: Self-pay | Admitting: Family Medicine

## 2017-01-03 NOTE — Progress Notes (Deleted)
Patient: Brandy JoyChristine M Margraf Female    DOB: 02/18/1919   83 y.o.   MRN: [id number] Visit Date: 01/03/2017  Today's Provider: Dortha Kernennis Chrismon, PA   No chief complaint on file.  Subjective:    HPI  Hypothyroid, follow-up:  TSH  Date Value Ref Range Status  10/11/2016 9.100 (H) 0.450 - 4.500 uIU/mL Final  06/28/2016 15.360 (H) 0.450 - 4.500 uIU/mL Final  02/12/2016 8.960 (H) 0.450 - 4.500 uIU/mL Final   Wt Readings from Last 3 Encounters:  10/11/16 175 lb 6.4 oz (79.6 kg)  10/11/16 175 lb 6.4 oz (79.6 kg)  07/12/16 177 lb 6.4 oz (80.5 kg)    She was last seen for hypothyroid 3 months ago.  Management since that visit includes Continue present dosage of the Levothyroxine. She reports good compliance with treatment. She is not having side effects.  She is not exercising. She is experiencing {Symptoms; thyroid:13835} She denies change in energy level, heat / cold intolerance, nervousness and palpitations Weight trend: {trend:16658}  ------------------------------------------------------------------------  Hypertension, follow-up:  BP Readings from Last 3 Encounters:  10/11/16 136/62  10/11/16 136/62  07/12/16 130/60    She was last seen for hypertension 3 months ago.  BP at that visit was 136/82. Management changes since that visit include continue medication. She reports good compliance with treatment. She is not having side effects.  She is not exercising. She is adherent to low salt diet.   Outside blood pressures are being checked. She is experiencing none.  Patient denies chest pain, chest pressure/discomfort, irregular heart beat and palpitations.   Cardiovascular risk factors include advanced age (older than 1655 for men, 4365 for women), dyslipidemia, hypertension and obesity (BMI >= 30 kg/m2).  Use of agents associated with hypertension: none.     Weight trend: {trend:16658} Wt Readings from Last 3 Encounters:  10/11/16 175 lb 6.4 oz (79.6 kg)  10/11/16 175  lb 6.4 oz (79.6 kg)  07/12/16 177 lb 6.4 oz (80.5 kg)    Current diet: in general, a "healthy" diet    ------------------------------------------------------------------------    Previous Medications   ACETAMINOPHEN (TYLENOL) 500 MG TABLET    Take 500 mg by mouth every 6 (six) hours as needed.   ALPHA-LIPOIC ACID 300 MG CAPS    TAKE (1) CAPSULE BY MOUTH ONCE DAILY.   AMLODIPINE (NORVASC) 10 MG TABLET    TAKE 1 TABLET BY MOUTH ONCE DAILY.   CHOLECALCIFEROL (VITAMIN D3) 2000 UNITS CAPSULE    TAKE (1) CAPSULE BY MOUTH ONCE DAILY.   FUROSEMIDE (LASIX) 40 MG TABLET    TAKE 1 TABLET BY MOUTH TWICE DAILY (MORNING AND LUNCH)   LATANOPROST (XALATAN) 0.005 % OPHTHALMIC SOLUTION    Place 1 drop into the right eye at bedtime.    LEVOTHYROXINE (SYNTHROID, LEVOTHROID) 50 MCG TABLET    Take 1 tablet (50 mcg total) by mouth daily before breakfast.   POTASSIUM CHLORIDE SA (K-DUR,KLOR-CON) 20 MEQ TABLET    TAKE 1 TABLET BY MOUTH ONCE DAILY.   TRAZODONE (DESYREL) 150 MG TABLET    TAKE ONE TABLET BY MOUTH AT BEDTIME.    Review of Systems  Constitutional: Negative.   Respiratory: Negative.   Cardiovascular: Negative.   Endocrine: Negative.   Musculoskeletal: Negative.     Social History  Substance Use Topics  . Smoking status: Never Smoker  . Smokeless tobacco: Never Used  . Alcohol use No   Objective:   There were no vitals taken for this visit.  Physical Exam  Assessment & Plan:       Follow up: No Follow-up on file.

## 2017-01-23 ENCOUNTER — Other Ambulatory Visit: Payer: Self-pay | Admitting: Family Medicine

## 2017-01-23 ENCOUNTER — Encounter: Payer: Self-pay | Admitting: Family Medicine

## 2017-01-23 ENCOUNTER — Ambulatory Visit (INDEPENDENT_AMBULATORY_CARE_PROVIDER_SITE_OTHER): Payer: Medicare Other | Admitting: Family Medicine

## 2017-01-23 VITALS — BP 124/52 | HR 72 | Temp 97.6°F | Resp 14 | Wt 176.0 lb

## 2017-01-23 DIAGNOSIS — R609 Edema, unspecified: Secondary | ICD-10-CM | POA: Diagnosis not present

## 2017-01-23 DIAGNOSIS — E039 Hypothyroidism, unspecified: Secondary | ICD-10-CM | POA: Diagnosis not present

## 2017-01-23 DIAGNOSIS — N184 Chronic kidney disease, stage 4 (severe): Secondary | ICD-10-CM

## 2017-01-23 NOTE — Progress Notes (Signed)
Patient: Brandy Schroeder Female    DOB: 31-May-1919   83 y.o.   MRN: [id number] Visit Date: 01/23/2017  Today's Provider: Dortha Kern, PA   Chief Complaint  Patient presents with  . Hypothyroidism  . Hypertension  . Pain  . Edema   Subjective:    HPI  Patient is here for 4 months follow up. Last office visit was on 10/11/16 for routine check up. At that time lab work was done: T4, TSH, CBC, metC-all was stable, no medication changes were made at that time.  Edema: patient is taking Lasix 40 mg twice daily and takes potassium supplement with it daily also. Weight has been stable. Patient denies chest pain, shortness of breath or wheezing.  Wt Readings from Last 3 Encounters:  01/23/17 176 lb (79.8 kg)  10/11/16 175 lb 6.4 oz (79.6 kg)  10/11/16 175 lb 6.4 oz (79.6 kg)   Insomnia: patient is sleeping well, taking Trazodone at bedtime every night.  HTN: patient has her b/p checked and readings vary-130s-110s/70-60s. No cardiac symptoms. Patient denies dizziness.  BP Readings from Last 3 Encounters:  01/23/17 (!) 124/52  10/11/16 136/62  10/11/16 136/62   Pain: patient takes Tylenol for joint pain, this regimen helps per patient. Leg weakness is present.  Patient Active Problem List   Diagnosis Date Noted  . Multiple falls 09/05/2015  . Paroxysmal A-fib (HCC) 09/05/2015  . Acute on chronic renal failure (HCC) 09/05/2015  . Rhabdomyolysis 09/05/2015  . Anxiety 11/19/2014  . 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 Black stool 11/19/2014  . Degeneration of lumbar or lumbosacral intervertebral disc 11/19/2014  . Clinical depression 11/19/2014  . H/O: osteoarthritis 11/19/2014  . Adult hypothyroidism 11/19/2014  . Left leg pain 11/19/2014  . Primary localized osteoarthrosis, lower leg 11/19/2014  . Edema, peripheral 11/19/2014  . CKD (chronic kidney disease), stage IV (HCC) 11/19/2014  . Acquired spondylolisthesis 11/19/2014  . Dermatitis, stasis 11/19/2014   Past Surgical  History:  Procedure Laterality Date  . ABDOMINAL HYSTERECTOMY    . CHOLECYSTECTOMY    . EYE SURGERY     Family History  Problem Relation Age of Onset  . Arthritis Mother   . Stroke Mother   . Heart disease Brother   . Arthritis Brother    Allergies  Allergen Reactions  . Sulfa Antibiotics     Current Outpatient Prescriptions:  .  acetaminophen (TYLENOL) 500 MG tablet, Take 500 mg by mouth every 6 (six) hours as needed., Disp: , Rfl:  .  Alpha-Lipoic Acid 300 MG CAPS, TAKE (1) CAPSULE BY MOUTH ONCE DAILY., Disp: 30 capsule, Rfl: 5 .  amLODipine (NORVASC) 10 MG tablet, TAKE 1 TABLET BY MOUTH ONCE DAILY., Disp: 30 tablet, Rfl: 5 .  Cholecalciferol (VITAMIN D3) 2000 units capsule, TAKE (1) CAPSULE BY MOUTH ONCE DAILY., Disp: 30 capsule, Rfl: 5 .  furosemide (LASIX) 40 MG tablet, TAKE 1 TABLET BY MOUTH TWICE DAILY (MORNING AND LUNCH), Disp: 60 tablet, Rfl: 3 .  latanoprost (XALATAN) 0.005 % ophthalmic solution, Place 1 drop into the right eye at bedtime. , Disp: , Rfl:  .  levothyroxine (SYNTHROID, LEVOTHROID) 50 MCG tablet, Take 1 tablet (50 mcg total) by mouth daily before breakfast., Disp: 30 tablet, Rfl: 3 .  potassium chloride SA (K-DUR,KLOR-CON) 20 MEQ tablet, TAKE 1 TABLET BY MOUTH ONCE DAILY., Disp: 30 tablet, Rfl: 5 .  traZODone (DESYREL) 150 MG tablet, TAKE ONE TABLET BY MOUTH AT BEDTIME., Disp: 30 tablet, Rfl: 5  Review of  Systems  Constitutional: Negative for chills and fever.  Respiratory: Negative.   Cardiovascular: Positive for leg swelling (on medication). Negative for chest pain and palpitations.  Musculoskeletal: Positive for arthralgias and gait problem.  Neurological: Positive for weakness. Negative for light-headedness and numbness.    Social History  Substance Use Topics  . Smoking status: Never Smoker  . Smokeless tobacco: Never Used  . Alcohol use No   Objective:   BP (!) 124/52   Pulse 72   Temp 97.6 F (36.4 C)   Resp 14   Wt 176 lb (79.8 kg)    SpO2 95%   BMI 29.29 kg/m  Physical Exam  Constitutional: She is oriented to person, place, and time. She appears well-developed and well-nourished. No distress.  HENT:  Head: Normocephalic and atraumatic.  Right Ear: Hearing normal.  Left Ear: Hearing normal.  Nose: Nose normal.  Eyes: Conjunctivae and lids are normal. Right eye exhibits no discharge. Left eye exhibits no discharge. No scleral icterus.  Neck: Neck supple.  Cardiovascular: Normal rate and regular rhythm.   Pulmonary/Chest: Effort normal and breath sounds normal. No respiratory distress.  Abdominal: Soft. Bowel sounds are normal.  Neurological: She is alert and oriented to person, place, and time.  Skin: Skin is intact. No lesion and no rash noted.  Psychiatric: She has a normal mood and affect. Her speech is normal and behavior is normal. Thought content normal.      Assessment & Plan:     1. CKD (chronic kidney disease), stage IV (HCC) Weight and BP stable. Recheck labs and restrict sodium intake. Recheck pending lab reports. - CBC with Differential/Platelet - Comprehensive metabolic panel  2. Edema, peripheral No significant dyspnea and edema/weight controlled. Still using Furosemide 40 mg BID with KCL supplement. Recheck labs and follow up in 3 months. - CBC with Differential/Platelet - Comprehensive metabolic panel  3. Adult hypothyroidism Tolerating Levothyroxine 50 mcg qd without tremor, weight loss or palpitations. Continue present dosage and recheck CBC. - CBC with Differential/Platelet     Dortha Kernennis Chrismon, PA  [facility]

## 2017-01-24 ENCOUNTER — Telehealth: Payer: Self-pay

## 2017-01-24 LAB — COMPREHENSIVE METABOLIC PANEL
ALK PHOS: 74 IU/L (ref 39–117)
ALT: 12 IU/L (ref 0–32)
AST: 16 IU/L (ref 0–40)
Albumin/Globulin Ratio: 1.5 (ref 1.2–2.2)
Albumin: 4.3 g/dL (ref 3.2–4.6)
BUN/Creatinine Ratio: 19 (ref 12–28)
BUN: 24 mg/dL (ref 10–36)
Bilirubin Total: 0.4 mg/dL (ref 0.0–1.2)
CALCIUM: 9.8 mg/dL (ref 8.7–10.3)
CO2: 28 mmol/L (ref 20–29)
CREATININE: 1.27 mg/dL — AB (ref 0.57–1.00)
Chloride: 98 mmol/L (ref 96–106)
GFR calc Af Amer: 41 mL/min/{1.73_m2} — ABNORMAL LOW (ref >59)
GFR, EST NON AFRICAN AMERICAN: 36 mL/min/{1.73_m2} — AB (ref >59)
GLUCOSE: 104 mg/dL — AB (ref 65–99)
Globulin, Total: 2.9 g/dL (ref 1.5–4.5)
POTASSIUM: 5 mmol/L (ref 3.5–5.2)
Sodium: 142 mmol/L (ref 134–144)
Total Protein: 7.2 g/dL (ref 6.0–8.5)

## 2017-01-24 LAB — CBC WITH DIFFERENTIAL/PLATELET
BASOS: 0 %
Basophils Absolute: 0 10*3/uL (ref 0.0–0.2)
EOS (ABSOLUTE): 0.1 10*3/uL (ref 0.0–0.4)
EOS: 1 %
HEMATOCRIT: 41.3 % (ref 34.0–46.6)
Hemoglobin: 13.5 g/dL (ref 11.1–15.9)
Immature Grans (Abs): 0.1 10*3/uL (ref 0.0–0.1)
Immature Granulocytes: 1 %
LYMPHS ABS: 1.3 10*3/uL (ref 0.7–3.1)
Lymphs: 23 %
MCH: 30 pg (ref 26.6–33.0)
MCHC: 32.7 g/dL (ref 31.5–35.7)
MCV: 92 fL (ref 79–97)
Monocytes Absolute: 0.3 10*3/uL (ref 0.1–0.9)
Monocytes: 6 %
NEUTROS ABS: 3.9 10*3/uL (ref 1.4–7.0)
Neutrophils: 69 %
Platelets: 277 10*3/uL (ref 150–379)
RBC: 4.5 x10E6/uL (ref 3.77–5.28)
RDW: 14.1 % (ref 12.3–15.4)
WBC: 5.7 10*3/uL (ref 3.4–10.8)

## 2017-01-24 NOTE — Telephone Encounter (Signed)
Lab results faxed to [facility] at [phone]

## 2017-01-24 NOTE — Telephone Encounter (Signed)
-----   Message from Jodell Ciproennis E Lewisburghrismon, [place] sent at 01/24/2017  8:26 AM EDT ----- Labs essentially normal. Kidney function tests shows some strain remaining but better than last check 3 months ago. Continue present medications and encourage fluid intake. Recheck appointment as planned.

## 2017-02-21 ENCOUNTER — Other Ambulatory Visit: Payer: Self-pay | Admitting: Family Medicine

## 2017-02-21 DIAGNOSIS — R609 Edema, unspecified: Secondary | ICD-10-CM

## 2017-03-13 DIAGNOSIS — H401414 Capsular glaucoma with pseudoexfoliation of lens, right eye, indeterminate stage: Secondary | ICD-10-CM | POA: Diagnosis not present

## 2017-03-22 ENCOUNTER — Other Ambulatory Visit: Payer: Self-pay | Admitting: Family Medicine

## 2017-04-13 ENCOUNTER — Ambulatory Visit: Payer: Medicare Other | Admitting: Family Medicine

## 2017-04-25 ENCOUNTER — Ambulatory Visit: Payer: Medicare Other | Admitting: Family Medicine

## 2017-04-25 NOTE — Progress Notes (Deleted)
   Patient: Brandy Schroeder Female    DOB: 02-Mar-1919   83 y.o.   MRN: [id number] Visit Date: 04/25/2017  Today's Provider: Dortha Kern, PA   No chief complaint on file.  Subjective:    HPI  Hypothyroid, follow-up:  [facility]   Lab Results  Component Value Date   TSH 9.100 (H) 10/11/2016   Wt Readings from Last 3 Encounters:  01/23/17 176 lb (79.8 kg)  10/11/16 175 lb 6.4 oz (79.6 kg)  10/11/16 175 lb 6.4 oz (79.6 kg)    She was last seen for hypothyroid 3 months ago.  Management since that visit includes continue Levothyroxine 50 mcg.  She reports good compliance with treatment. She is not having side effects.  She is not exercising. She is experiencing weight changes She denies change in energy level, diarrhea, heat / cold intolerance, nervousness and palpitations Weight trend: fluctuating a bit  ------------------------------------------------------------------------ Edema:  Patient is taking Lasix 40 mg twice daily and takes a potassium supplement daily. Weight has been stable. Patient denies chest pain, shortness of breath or wheezing.   Previous Medications   ACETAMINOPHEN (TYLENOL) 500 MG TABLET    Take 500 mg by mouth every 6 (six) hours as needed.   ALPHA-LIPOIC ACID 300 MG CAPS    TAKE (1) CAPSULE BY MOUTH ONCE DAILY.   AMLODIPINE (NORVASC) 10 MG TABLET    TAKE 1 TABLET BY MOUTH ONCE DAILY.   D3 SUPER STRENGTH 2000 UNITS CAPS    TAKE (1) CAPSULE BY MOUTH ONCE DAILY.   FUROSEMIDE (LASIX) 40 MG TABLET    TAKE 1 TABLET BY MOUTH TWICE DAILY (MORNING AND LUNCH)   LATANOPROST (XALATAN) 0.005 % OPHTHALMIC SOLUTION    Place 1 drop into the right eye at bedtime.    LEVOTHYROXINE (SYNTHROID, LEVOTHROID) 50 MCG TABLET    TAKE 1 TABLET BY MOUTH ONCE DAILY BEFORE BREAKFAST.   POTASSIUM CHLORIDE SA (K-DUR,KLOR-CON) 20 MEQ TABLET    TAKE 1 TABLET BY MOUTH ONCE DAILY.   TRAZODONE (DESYREL) 150 MG TABLET    TAKE ONE TABLET BY MOUTH AT BEDTIME.    Review of Systems    Constitutional: Negative.   Respiratory: Negative.   Cardiovascular: Negative.     Social History  Substance Use Topics  . Smoking status: Never Smoker  . Smokeless tobacco: Never Used  . Alcohol use No   Objective:   There were no vitals taken for this visit.  Physical Exam      Assessment & Plan:       Follow up: No Follow-up on file.

## 2017-05-17 ENCOUNTER — Other Ambulatory Visit: Payer: Self-pay | Admitting: Family Medicine

## 2017-05-22 ENCOUNTER — Encounter: Payer: Self-pay | Admitting: Family Medicine

## 2017-05-22 ENCOUNTER — Ambulatory Visit (INDEPENDENT_AMBULATORY_CARE_PROVIDER_SITE_OTHER): Payer: Medicare Other | Admitting: Family Medicine

## 2017-05-22 VITALS — BP 122/62 | HR 74 | Temp 97.5°F | Wt 166.0 lb

## 2017-05-22 DIAGNOSIS — R609 Edema, unspecified: Secondary | ICD-10-CM

## 2017-05-22 DIAGNOSIS — N184 Chronic kidney disease, stage 4 (severe): Secondary | ICD-10-CM | POA: Diagnosis not present

## 2017-05-22 DIAGNOSIS — Z23 Encounter for immunization: Secondary | ICD-10-CM | POA: Diagnosis not present

## 2017-05-22 DIAGNOSIS — F5105 Insomnia due to other mental disorder: Secondary | ICD-10-CM | POA: Diagnosis not present

## 2017-05-22 DIAGNOSIS — F419 Anxiety disorder, unspecified: Secondary | ICD-10-CM

## 2017-05-22 DIAGNOSIS — I10 Essential (primary) hypertension: Secondary | ICD-10-CM

## 2017-05-22 LAB — COMPLETE METABOLIC PANEL WITH GFR
AG Ratio: 1.4 (calc) (ref 1.0–2.5)
ALBUMIN MSPROF: 4.2 g/dL (ref 3.6–5.1)
ALKALINE PHOSPHATASE (APISO): 70 U/L (ref 33–130)
ALT: 13 U/L (ref 6–29)
AST: 15 U/L (ref 10–35)
BILIRUBIN TOTAL: 0.4 mg/dL (ref 0.2–1.2)
BUN / CREAT RATIO: 24 (calc) — AB (ref 6–22)
BUN: 38 mg/dL — ABNORMAL HIGH (ref 7–25)
CHLORIDE: 103 mmol/L (ref 98–110)
CO2: 31 mmol/L (ref 20–32)
Calcium: 9.6 mg/dL (ref 8.6–10.4)
Creat: 1.6 mg/dL — ABNORMAL HIGH (ref 0.60–0.88)
GFR, EST AFRICAN AMERICAN: 31 mL/min/{1.73_m2} — AB (ref >=60)
GFR, Est Non African American: 27 mL/min/{1.73_m2} — ABNORMAL LOW (ref >=60)
GLOBULIN: 2.9 g/dL (ref 1.9–3.7)
Glucose, Bld: 106 mg/dL — ABNORMAL HIGH (ref 65–99)
Potassium: 4.8 mmol/L (ref 3.5–5.3)
SODIUM: 144 mmol/L (ref 135–146)
TOTAL PROTEIN: 7.1 g/dL (ref 6.1–8.1)

## 2017-05-22 LAB — CBC WITH DIFFERENTIAL/PLATELET
BASOS ABS: 17 {cells}/uL (ref 0–200)
Basophils Relative: 0.3 %
EOS ABS: 51 {cells}/uL (ref 15–500)
Eosinophils Relative: 0.9 %
HEMATOCRIT: 39.6 % (ref 35.0–45.0)
HEMOGLOBIN: 13.4 g/dL (ref 11.7–15.5)
LYMPHS ABS: 1123 {cells}/uL (ref 850–3900)
MCH: 30.7 pg (ref 27.0–33.0)
MCHC: 33.8 g/dL (ref 32.0–36.0)
MCV: 90.6 fL (ref 80.0–100.0)
MONOS PCT: 9.2 %
MPV: 11.1 fL (ref 7.5–12.5)
NEUTROS ABS: 3984 {cells}/uL (ref 1500–7800)
Neutrophils Relative %: 69.9 %
Platelets: 228 10*3/uL (ref 140–400)
RBC: 4.37 10*6/uL (ref 3.80–5.10)
RDW: 12.7 % (ref 11.0–15.0)
Total Lymphocyte: 19.7 %
WBC: 5.7 10*3/uL (ref 3.8–10.8)
WBCMIX: 524 {cells}/uL (ref 200–950)

## 2017-05-22 NOTE — Progress Notes (Signed)
Patient: Brandy Schroeder Female    DOB: 1919-04-19   83 y.o.   MRN: [id number] Visit Date: 05/22/2017  Today's Provider: Vernie Murders, PA   Chief Complaint  Patient presents with  . Follow-up   Subjective:    HPI Patient is here for 3 months follow up. Last office visit was on 01/23/17 for a routine check up. At that time lab work was done: Met C and CBC results showed kidney function has some strain remaining, but better than last check. No medication changes were made at that time. Patient advised to increase fluid intake. Patient is doing fairly well with fluid intake.    Edema: patient is taking Lasix 40 mg twice daily and takes potassium supplement with it daily also. Weight has been stable. Patient denies chest pain, shortness of breath or wheezing.  Wt Readings from Last 3 Encounters:  05/22/17 166 lb (75.3 kg)  01/23/17 176 lb (79.8 kg)  10/11/16 175 lb 6.4 oz (79.6 kg)   Insomnia:  Patient is sleeping well, she is taking Trazodone 150 mg at bedtime each night.  HTN: Patient has her blood pressure checked and readings are varying between 102-120's/60-70's. No cardiac symptoms. Patient denies dizziness.  BP Readings from Last 3 Encounters:  05/22/17 122/62  01/23/17 (!) 124/52  10/11/16 136/62      Previous Medications   ACETAMINOPHEN (TYLENOL) 500 MG TABLET    Take 500 mg by mouth every 6 (six) hours as needed.   ALPHA-LIPOIC ACID 300 MG CAPS    TAKE (1) CAPSULE BY MOUTH ONCE DAILY.   AMLODIPINE (NORVASC) 10 MG TABLET    TAKE 1 TABLET BY MOUTH ONCE DAILY.   D3 SUPER STRENGTH 2000 UNITS CAPS    TAKE (1) CAPSULE BY MOUTH ONCE DAILY.   FUROSEMIDE (LASIX) 40 MG TABLET    TAKE 1 TABLET BY MOUTH TWICE DAILY (MORNING AND LUNCH)   LATANOPROST (XALATAN) 0.005 % OPHTHALMIC SOLUTION    Place 1 drop into the right eye at bedtime.    LEVOTHYROXINE (SYNTHROID, LEVOTHROID) 50 MCG TABLET    TAKE 1 TABLET BY MOUTH ONCE DAILY BEFORE BREAKFAST.   POTASSIUM CHLORIDE SA  (K-DUR,KLOR-CON) 20 MEQ TABLET    TAKE 1 TABLET BY MOUTH ONCE DAILY.   TRAZODONE (DESYREL) 150 MG TABLET    TAKE ONE TABLET BY MOUTH AT BEDTIME.    Review of Systems  Constitutional: Negative.   Respiratory: Negative.   Cardiovascular: Negative.     Social History  Substance Use Topics  . Smoking status: Never Smoker  . Smokeless tobacco: Never Used  . Alcohol use No   Objective:   BP 122/62 (BP Location: Left Arm, Patient Position: Sitting, Cuff Size: Normal)   Pulse 74   Temp (!) 97.5 F (36.4 C) (Oral)   Wt 166 lb (75.3 kg)   SpO2 95%   BMI 27.62 kg/m   Physical Exam  Constitutional: She is oriented to person, place, and time. She appears well-developed and well-nourished. No distress.  HENT:  Head: Normocephalic and atraumatic.  Right Ear: Hearing and external ear normal.  Left Ear: Hearing and external ear normal.  Nose: Nose normal.  Hearing diminished but seems to understand well.  Eyes: Conjunctivae and lids are normal. Right eye exhibits no discharge. Left eye exhibits no discharge. No scleral icterus.  Blind in the left eye. Followed by ophthalmologist every 6 months.  Neck: Neck supple. No thyromegaly present.  Cardiovascular: Normal rate and regular rhythm.   Pulmonary/Chest: Effort  normal. No respiratory distress.  Abdominal: Soft. Bowel sounds are normal.  Musculoskeletal: Normal range of motion.  Neurological: She is alert and oriented to person, place, and time.  Skin: Skin is intact. No lesion and no rash noted.  Psychiatric: She has a normal mood and affect. Her speech is normal and behavior is normal. Thought content normal.      Assessment & Plan:     1. CKD (chronic kidney disease), stage IV (HCC) Last creatinine was 1.27 on 01-23-17. Continues to have some peripheral edema but no complaints. Recheck CBC and CMP. Continue present medication and diet regimen. Follow up in March 2019 for annual screening. - CBC with Differential/Platelet -  Comprehensive metabolic panel  2. Edema, peripheral Weight down by 10 lbs the past 3 months. No dyspnea, chest pains, palpitations or dizziness. Edema of lower legs stable but still at a 3+ level. Recheck CBC and CMP. Continues Lasix 40 mg BID and KCL 20 meq BID. - CBC with Differential/Platelet - Comprehensive metabolic panel  3. Hyposomnia, insomnia or sleeplessness associated with anxiety Sleeping better on the Trazodone 150 mg at bedtime. Anxiety more controlled. Will continue this regimen.  4. Essential hypertension Well controlled with Amlodipine 10 mg qd and Lasix 40 mg BID with KCL 20 meq BID. Recheck routine labs and follow up prn. - CBC with Differential/Platelet - Comprehensive metabolic panel  5. Need for influenza vaccination - Flu vaccine HIGH DOSE PF

## 2017-06-12 ENCOUNTER — Other Ambulatory Visit: Payer: Self-pay | Admitting: Family Medicine

## 2017-06-12 DIAGNOSIS — R609 Edema, unspecified: Secondary | ICD-10-CM

## 2017-06-12 NOTE — Telephone Encounter (Signed)
Pharmacy requesting refills. Thanks!  

## 2017-06-13 ENCOUNTER — Telehealth: Payer: Self-pay | Admitting: Family Medicine

## 2017-06-13 NOTE — Telephone Encounter (Signed)
Order faxed to [facility] at [phone]

## 2017-06-13 NOTE — Telephone Encounter (Signed)
Prescriptions written for standing order to use Robitussin-DM prn cough or congestion.

## 2017-06-13 NOTE — Telephone Encounter (Signed)
Brandy Schroeder with [facility] is requesting written standing orders for OTC cold medication. Brandy Schroeder stated they have had a lot of cold symptoms going around and with the holiday coming up they would like to have orders on hand in case pt needs something. Fax# [phone]. Please advise. Thanks TNP

## 2017-06-21 ENCOUNTER — Telehealth: Payer: Self-pay | Admitting: Family Medicine

## 2017-06-21 NOTE — Telephone Encounter (Signed)
Tonya with [facility] called stating pt still has a cough and congestion.  Pt is coughing up phlegm that is yellow.  Pt does not have a fever.  They are asking if pt will need more than the cough medication?  Please advise.  [address] [place]  ZO#[phone]/WJCB#[phone]/MW

## 2017-06-21 NOTE — Telephone Encounter (Signed)
Spoke with Archie Pattenonya on the phone she states that patient symptoms have cleared ( please see previous telephone encounter 06/13/17) but that patients cough still lingers and now is productive of yellow phlegm. Archie Pattenonya denies that patient complains of shortness of breath or wheezing, I advised Tonya that since it has been more than 7 days now it would probably be best to bring patient in the office. I advised Tonya that [name] was out of the office this evening, she states matter can wait till tomorrow and if [name] feels that patient needs to be seen than she will schedule appt. KW

## 2017-06-22 ENCOUNTER — Encounter: Payer: Self-pay | Admitting: Physician Assistant

## 2017-06-22 ENCOUNTER — Ambulatory Visit
Admission: RE | Admit: 2017-06-22 | Discharge: 2017-06-22 | Disposition: A | Discharge status: Home / Self Care | Payer: Medicare Other | Source: Ambulatory Visit | Attending: Physician Assistant | Admitting: Physician Assistant

## 2017-06-22 ENCOUNTER — Ambulatory Visit (INDEPENDENT_AMBULATORY_CARE_PROVIDER_SITE_OTHER): Payer: Medicare Other | Admitting: Physician Assistant

## 2017-06-22 VITALS — BP 124/76 | HR 72 | Temp 97.5°F

## 2017-06-22 DIAGNOSIS — R059 Cough, unspecified: Secondary | ICD-10-CM

## 2017-06-22 DIAGNOSIS — R05 Cough: Secondary | ICD-10-CM | POA: Insufficient documentation

## 2017-06-22 MED ORDER — DOXYCYCLINE HYCLATE 100 MG PO TABS: 100.0000 mg | ORAL_TABLET | Freq: Two times a day (BID) | ORAL | 0 refills | Status: AC

## 2017-06-22 NOTE — Patient Instructions (Signed)

## 2017-06-22 NOTE — Telephone Encounter (Signed)
Please schedule appointment.

## 2017-06-22 NOTE — Telephone Encounter (Signed)
appt scheduled this afternoon with Adrianna. KW

## 2017-06-22 NOTE — Progress Notes (Signed)
Patient: Brandy JoyChristine M Krouse Female    DOB: 11/21/1918   83 y.o.   MRN: [id number] Visit Date: 06/22/2017  Today's Provider: Trey SailorsAdriana M Pollak, PA-C   Chief Complaint  Patient presents with  . Cough    Started over a week ago.   Subjective:    Brandy Schroeder is a 83 y/o woman with history of residing in an assisted living facility presenting today with cough x 1 week. She cites multiple sick contacts with similar respiratory symptoms. Her cough was at first productive of clear sputum, which then turned to green. She denies fever, fatigue, vomiting. She was placed on Robitussin DM which was helpful for some time, but she and caregiver were concerned about change in sputum production.    Cough  This is a new problem. The current episode started more than 1 year ago. The problem has been gradually worsening. The cough is productive of sputum. Associated symptoms include chills, postnasal drip and a sore throat. Pertinent negatives include no ear pain, fever, headaches, rhinorrhea, shortness of breath or wheezing. She has tried OTC cough suppressant for the symptoms. The treatment provided mild relief.       Allergies  Allergen Reactions  . Sulfa Antibiotics      Current Outpatient Medications:  .  acetaminophen (TYLENOL) 500 MG tablet, Take 500 mg by mouth every 6 (six) hours as needed., Disp: , Rfl:  .  Alpha-Lipoic Acid 300 MG CAPS, TAKE (1) CAPSULE BY MOUTH ONCE DAILY., Disp: 30 capsule, Rfl: 3 .  amLODipine (NORVASC) 10 MG tablet, TAKE 1 TABLET BY MOUTH ONCE DAILY., Disp: 30 tablet, Rfl: 3 .  D3 SUPER STRENGTH 2000 units CAPS, TAKE (1) CAPSULE BY MOUTH ONCE DAILY., Disp: 30 capsule, Rfl: 3 .  furosemide (LASIX) 40 MG tablet, TAKE 1 TABLET BY MOUTH TWICE DAILY (MORNING AND LUNCH), Disp: 60 tablet, Rfl: 3 .  latanoprost (XALATAN) 0.005 % ophthalmic solution, Place 1 drop into the right eye at bedtime. , Disp: , Rfl:  .  levothyroxine (SYNTHROID, LEVOTHROID) 50 MCG tablet,  TAKE 1 TABLET BY MOUTH ONCE DAILY BEFORE BREAKFAST., Disp: 30 tablet, Rfl: 3 .  potassium chloride SA (K-DUR,KLOR-CON) 20 MEQ tablet, TAKE 1 TABLET BY MOUTH ONCE DAILY., Disp: 30 tablet, Rfl: 6 .  traZODone (DESYREL) 150 MG tablet, TAKE ONE TABLET BY MOUTH AT BEDTIME., Disp: 30 tablet, Rfl: 3  Review of Systems  Constitutional: Positive for chills and fatigue. Negative for activity change, appetite change, diaphoresis, fever and unexpected weight change.  HENT: Positive for postnasal drip and sore throat. Negative for congestion, ear discharge, ear pain, nosebleeds, rhinorrhea, sinus pressure, sinus pain, tinnitus, trouble swallowing and voice change.   Eyes: Negative.   Respiratory: Positive for cough. Negative for apnea, choking, chest tightness, shortness of breath, wheezing and stridor.   Gastrointestinal: Positive for nausea. Negative for abdominal distention, abdominal pain, anal bleeding, blood in stool, constipation, diarrhea, rectal pain and vomiting.  Neurological: Negative for dizziness, light-headedness and headaches.    Social History   Tobacco Use  . Smoking status: Never Smoker  . Smokeless tobacco: Never Used  Substance Use Topics  . Alcohol use: No    Alcohol/week: 0.0 oz   Objective:   BP 124/76 (BP Location: Left Arm, Patient Position: Sitting, Cuff Size: Normal)   Pulse 72   Temp (!) 97.5 F (36.4 C) (Oral)   SpO2 95%  Vitals:   06/22/17 1400  BP: 124/76  Pulse: 72  Temp: (!)  97.5 F (36.4 C)  TempSrc: Oral  SpO2: 95%     Physical Exam  Constitutional: She is oriented to person, place, and time. She appears well-developed and well-nourished.  Cardiovascular: Normal rate and regular rhythm.  Pulmonary/Chest: Effort normal and breath sounds normal. She has no wheezes.  Neurological: She is alert and oriented to person, place, and time.  Skin: Skin is warm and dry.  Psychiatric: She has a normal mood and affect. Her behavior is normal.          Assessment & Plan:     1. Cough  CXR is normal, I have personally reviewed the images. She may continue doxycycline. Please follow up with Maurine Ministerennis in one week.   - DG Chest 2 View; Future - doxycycline (VIBRA-TABS) 100 MG tablet; Take 1 tablet (100 mg total) by mouth 2 (two) times daily for 7 days.  Dispense: 14 tablet; Refill: 0  Return in about 5 days (around 06/27/2017) for cough.  The entirety of the information documented in the History of Present Illness, Review of Systems and Physical Exam were personally obtained by me. Portions of this information were initially documented by Kavin LeechLaura Walsh, CMA and reviewed by me for thoroughness and accuracy.        Trey SailorsAdriana M Pollak, PA-C  [facility]

## 2017-06-23 NOTE — Progress Notes (Signed)
Advised  ED 

## 2017-06-30 ENCOUNTER — Encounter: Payer: Self-pay | Admitting: Physician Assistant

## 2017-06-30 ENCOUNTER — Ambulatory Visit (INDEPENDENT_AMBULATORY_CARE_PROVIDER_SITE_OTHER): Payer: Medicare Other | Admitting: Physician Assistant

## 2017-06-30 VITALS — BP 126/66 | HR 76 | Temp 97.7°F | Resp 16

## 2017-06-30 DIAGNOSIS — R05 Cough: Secondary | ICD-10-CM

## 2017-06-30 DIAGNOSIS — R059 Cough, unspecified: Secondary | ICD-10-CM

## 2017-06-30 NOTE — Patient Instructions (Signed)
Upper Respiratory Infection, Adult Most upper respiratory infections (URIs) are caused by a virus. A URI affects the nose, throat, and upper air passages. The most common type of URI is often called "the common cold." Follow these instructions at home:  Take medicines only as told by your doctor.  Gargle warm saltwater or take cough drops to comfort your throat as told by your doctor.  Use a warm mist humidifier or inhale steam from a shower to increase air moisture. This may make it easier to breathe.  Drink enough fluid to keep your pee (urine) clear or pale yellow.  Eat soups and other clear broths.  Have a healthy diet.  Rest as needed.  Go back to work when your fever is gone or your doctor says it is okay. ? You may need to stay home longer to avoid giving your URI to others. ? You can also wear a face mask and wash your hands often to prevent spread of the virus.  Use your inhaler more if you have asthma.  Do not use any tobacco products, including cigarettes, chewing tobacco, or electronic cigarettes. If you need help quitting, ask your doctor. Contact a doctor if:  You are getting worse, not better.  Your symptoms are not helped by medicine.  You have chills.  You are getting more short of breath.  You have brown or red mucus.  You have yellow or brown discharge from your nose.  You have pain in your face, especially when you bend forward.  You have a fever.  You have puffy (swollen) neck glands.  You have pain while swallowing.  You have white areas in the back of your throat. Get help right away if:  You have very bad or constant: ? Headache. ? Ear pain. ? Pain in your forehead, behind your eyes, and over your cheekbones (sinus pain). ? Chest pain.  You have long-lasting (chronic) lung disease and any of the following: ? Wheezing. ? Long-lasting cough. ? Coughing up blood. ? A change in your usual mucus.  You have a stiff neck.  You have  changes in your: ? Vision. ? Hearing. ? Thinking. ? Mood. This information is not intended to replace advice given to you by your health care provider. Make sure you discuss any questions you have with your health care provider. Document Released: 12/28/2007 Document Revised: 03/13/2016 Document Reviewed: 10/16/2013 Elsevier Interactive Patient Education  2018 Elsevier Inc.  

## 2017-06-30 NOTE — Progress Notes (Signed)
Patient: Brandy JoyChristine M Borjon Female    DOB: 04/07/1919   83 y.o.   MRN: [id number] Visit Date: 06/30/2017  Today's Provider: Trey SailorsAdriana M Pollak, PA-C   Chief Complaint  Patient presents with  . Cough    One week follow up; pt finished Doxycycline.   Subjective:    Brandy Schroeder is a 83 y/o woman presenting today for follow up of cough. She was seen last week in clinic with URI symptoms and cough. CXR clear, she was prescribed 7 days doxycycline which she has taken without issue. Feels improved, less cough. Does endorse some PND. NO fevers, chills, N/V.  Cough  This is a new problem. The problem has been gradually improving. The cough is non-productive. Associated symptoms include postnasal drip. Pertinent negatives include no eye redness, headaches, rhinorrhea, shortness of breath or wheezing.       Allergies  Allergen Reactions  . Sulfa Antibiotics      Current Outpatient Medications:  .  acetaminophen (TYLENOL) 500 MG tablet, Take 500 mg by mouth every 6 (six) hours as needed., Disp: , Rfl:  .  Alpha-Lipoic Acid 300 MG CAPS, TAKE (1) CAPSULE BY MOUTH ONCE DAILY., Disp: 30 capsule, Rfl: 3 .  amLODipine (NORVASC) 10 MG tablet, TAKE 1 TABLET BY MOUTH ONCE DAILY., Disp: 30 tablet, Rfl: 3 .  D3 SUPER STRENGTH 2000 units CAPS, TAKE (1) CAPSULE BY MOUTH ONCE DAILY., Disp: 30 capsule, Rfl: 3 .  furosemide (LASIX) 40 MG tablet, TAKE 1 TABLET BY MOUTH TWICE DAILY (MORNING AND LUNCH), Disp: 60 tablet, Rfl: 3 .  latanoprost (XALATAN) 0.005 % ophthalmic solution, Place 1 drop into the right eye at bedtime. , Disp: , Rfl:  .  levothyroxine (SYNTHROID, LEVOTHROID) 50 MCG tablet, TAKE 1 TABLET BY MOUTH ONCE DAILY BEFORE BREAKFAST., Disp: 30 tablet, Rfl: 3 .  potassium chloride SA (K-DUR,KLOR-CON) 20 MEQ tablet, TAKE 1 TABLET BY MOUTH ONCE DAILY., Disp: 30 tablet, Rfl: 6 .  traZODone (DESYREL) 150 MG tablet, TAKE ONE TABLET BY MOUTH AT BEDTIME., Disp: 30 tablet, Rfl: 3  Review of  Systems  Constitutional: Negative.   HENT: Positive for congestion and postnasal drip. Negative for rhinorrhea.   Eyes: Positive for discharge. Negative for photophobia, pain, redness, itching and visual disturbance.  Respiratory: Positive for cough. Negative for apnea, choking, chest tightness, shortness of breath, wheezing and stridor.   Gastrointestinal: Negative.   Neurological: Negative for dizziness, light-headedness and headaches.    Social History   Tobacco Use  . Smoking status: Never Smoker  . Smokeless tobacco: Never Used  Substance Use Topics  . Alcohol use: No    Alcohol/week: 0.0 oz   Objective:   BP 126/66 (BP Location: Right Arm, Patient Position: Sitting, Cuff Size: Normal)   Pulse 76   Temp 97.7 F (36.5 C) (Oral)   Resp 16   SpO2 96%  Vitals:   06/30/17 0833  BP: 126/66  Pulse: 76  Resp: 16  Temp: 97.7 F (36.5 C)  TempSrc: Oral  SpO2: 96%     Physical Exam  Constitutional: She is oriented to person, place, and time. She appears well-developed and well-nourished.  HENT:  Mouth/Throat: Oropharynx is clear and moist. No oropharyngeal exudate.  Eyes: Conjunctivae are normal.  Neck: Neck supple.  Cardiovascular: Normal rate and regular rhythm.  Pulmonary/Chest: Effort normal. She has wheezes.  Some mild expiratory wheezing.   Lymphadenopathy:    She has no cervical adenopathy.  Neurological: She is alert  and oriented to person, place, and time.  Skin: Skin is warm and dry.  Psychiatric: She has a normal mood and affect. Her behavior is normal.        Assessment & Plan:     1. Cough  Improved. Continue to watch for signs of fever, productive cough, N/V.  Return if symptoms worsen or fail to improve.  The entirety of the information documented in the History of Present Illness, Review of Systems and Physical Exam were personally obtained by me. Portions of this information were initially documented by Kavin LeechLaura Walsh, CMA and reviewed by me for  thoroughness and accuracy.           Trey SailorsAdriana M Pollak, PA-C  [facility]

## 2017-07-10 ENCOUNTER — Other Ambulatory Visit: Payer: Self-pay | Admitting: Family Medicine

## 2017-07-31 ENCOUNTER — Telehealth: Payer: Self-pay | Admitting: Family Medicine

## 2017-07-31 ENCOUNTER — Other Ambulatory Visit: Payer: Self-pay | Admitting: Family Medicine

## 2017-07-31 DIAGNOSIS — N184 Chronic kidney disease, stage 4 (severe): Secondary | ICD-10-CM

## 2017-07-31 NOTE — Telephone Encounter (Signed)
Order placed in the chart as a future order to recheck Chronic Kidney Disease - Stage IV in 4-6 weeks. Come by the front desk and ask for the lab requisition to be printed out to get the labs done.

## 2017-07-31 NOTE — Telephone Encounter (Signed)
Lab slip printed at front desk for pick up. [facility].

## 2017-07-31 NOTE — Telephone Encounter (Signed)
The home called and wanted Brandy Schroeder to have bloodwork orders in the computer for patient.  CB# [phone]

## 2017-08-03 DIAGNOSIS — N184 Chronic kidney disease, stage 4 (severe): Secondary | ICD-10-CM | POA: Diagnosis not present

## 2017-08-04 LAB — CBC WITH DIFFERENTIAL/PLATELET
BASOS ABS: 0 10*3/uL (ref 0.0–0.2)
BASOS: 1 %
EOS (ABSOLUTE): 0.1 10*3/uL (ref 0.0–0.4)
Eos: 3 %
Hematocrit: 38.6 % (ref 34.0–46.6)
Hemoglobin: 12.7 g/dL (ref 11.1–15.9)
IMMATURE GRANS (ABS): 0 10*3/uL (ref 0.0–0.1)
IMMATURE GRANULOCYTES: 1 %
LYMPHS: 23 %
Lymphocytes Absolute: 1 10*3/uL (ref 0.7–3.1)
MCH: 30.8 pg (ref 26.6–33.0)
MCHC: 32.9 g/dL (ref 31.5–35.7)
MCV: 94 fL (ref 79–97)
Monocytes Absolute: 0.4 10*3/uL (ref 0.1–0.9)
Monocytes: 9 %
NEUTROS PCT: 63 %
Neutrophils Absolute: 2.7 10*3/uL (ref 1.4–7.0)
PLATELETS: 220 10*3/uL (ref 150–379)
RBC: 4.12 x10E6/uL (ref 3.77–5.28)
RDW: 15 % (ref 12.3–15.4)
WBC: 4.2 10*3/uL (ref 3.4–10.8)

## 2017-08-04 LAB — COMPREHENSIVE METABOLIC PANEL
A/G RATIO: 1.7 (ref 1.2–2.2)
ALT: 17 IU/L (ref 0–32)
AST: 19 IU/L (ref 0–40)
Albumin: 4.1 g/dL (ref 3.2–4.6)
Alkaline Phosphatase: 75 IU/L (ref 39–117)
BILIRUBIN TOTAL: 0.3 mg/dL (ref 0.0–1.2)
BUN/Creatinine Ratio: 22 (ref 12–28)
BUN: 32 mg/dL (ref 10–36)
CALCIUM: 9.1 mg/dL (ref 8.7–10.3)
CHLORIDE: 103 mmol/L (ref 96–106)
CO2: 25 mmol/L (ref 20–29)
Creatinine, Ser: 1.45 mg/dL — ABNORMAL HIGH (ref 0.57–1.00)
GFR calc Af Amer: 35 mL/min/{1.73_m2} — ABNORMAL LOW (ref >59)
GFR calc non Af Amer: 30 mL/min/{1.73_m2} — ABNORMAL LOW (ref >59)
Globulin, Total: 2.4 g/dL (ref 1.5–4.5)
Glucose: 91 mg/dL (ref 65–99)
POTASSIUM: 4.8 mmol/L (ref 3.5–5.2)
Sodium: 144 mmol/L (ref 134–144)
TOTAL PROTEIN: 6.5 g/dL (ref 6.0–8.5)

## 2017-08-07 ENCOUNTER — Other Ambulatory Visit: Payer: Self-pay | Admitting: Family Medicine

## 2017-08-08 ENCOUNTER — Telehealth: Payer: Self-pay | Admitting: Family Medicine

## 2017-08-08 NOTE — Telephone Encounter (Signed)
LMTCB

## 2017-08-08 NOTE — Telephone Encounter (Signed)
Tonya returned call. Tonya scheduled appt with Maurine Ministerennis on Thursday 08/10/17 @ 8:30 am. Thanks TNP

## 2017-08-08 NOTE — Telephone Encounter (Addendum)
Brandy Schroeder from [facility] is calling because patient has a place on her leg that looks like a blood blister a little bigger than a fifty cent piece.  No unusual swelling.  She states that it was not there yesterday morning but she noticed it when she gave her a bath last night.  She does not know how it got there.   The patient states that she does not recall hitting it on anything.  They are keeping her leg elevated.  She is wanting to know what else to do for it.  She doesn't know if it will pop or what to expect.  She doesn't know if you will need to see her or if she should wait.  She is requesting a call back.  CB# [phone]

## 2017-08-08 NOTE — Telephone Encounter (Signed)
Should schedule appointment to be sure how to best treat this.

## 2017-08-10 ENCOUNTER — Ambulatory Visit (INDEPENDENT_AMBULATORY_CARE_PROVIDER_SITE_OTHER): Payer: Medicare Other | Admitting: Family Medicine

## 2017-08-10 ENCOUNTER — Encounter: Payer: Self-pay | Admitting: Family Medicine

## 2017-08-10 VITALS — BP 116/60 | HR 66 | Temp 97.8°F | Wt 174.0 lb

## 2017-08-10 DIAGNOSIS — S8012XA Contusion of left lower leg, initial encounter: Secondary | ICD-10-CM | POA: Diagnosis not present

## 2017-08-10 NOTE — Progress Notes (Signed)
Patient: Brandy Schroeder Female    DOB: July 25, 1919   84 y.o.   MRN: [id number] Visit Date: 08/10/2017  Today's Provider: Dortha Kern, PA   Chief Complaint  Patient presents with  . Leg Pain   Subjective:    Leg Pain   Incident onset: Monday night. The injury mechanism is unknown. The pain is present in the left leg. The quality of the pain is described as burning. The pain has been constant since onset. Associated symptoms comments: Swelling . She has tried nothing for the symptoms.  Patient is a resident at [facility]. The caregiver reports she noticed the dark colored area on left lower leg on Monday night while bathing patient. Patient does wear TED hoses and she pulls them up and down throughout the day.   Patient Active Problem List   Diagnosis Date Noted  . Multiple falls 09/05/2015  . Paroxysmal A-fib (HCC) 09/05/2015  . Acute on chronic renal failure (HCC) 09/05/2015  . Rhabdomyolysis 09/05/2015  . Anxiety 11/19/2014  . 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 Black stool 11/19/2014  . Degeneration of lumbar or lumbosacral intervertebral disc 11/19/2014  . Clinical depression 11/19/2014  . H/O: osteoarthritis 11/19/2014  . Adult hypothyroidism 11/19/2014  . Left leg pain 11/19/2014  . Primary localized osteoarthrosis, lower leg 11/19/2014  . Edema, peripheral 11/19/2014  . CKD (chronic kidney disease), stage IV (HCC) 11/19/2014  . Acquired spondylolisthesis 11/19/2014  . Dermatitis, stasis 11/19/2014   Past Surgical History:  Procedure Laterality Date  . ABDOMINAL HYSTERECTOMY    . CHOLECYSTECTOMY    . EYE SURGERY     Family History  Problem Relation Age of Onset  . Arthritis Mother   . Stroke Mother   . Heart disease Brother   . Arthritis Brother    Allergies  Allergen Reactions  . Sulfa Antibiotics     Current Outpatient Medications:  .  acetaminophen (TYLENOL) 500 MG tablet, Take 500 mg by mouth every 6 (six) hours as needed., Disp: , Rfl:  .   Alpha-Lipoic Acid 300 MG CAPS, TAKE (1) CAPSULE BY MOUTH ONCE DAILY., Disp: 30 capsule, Rfl: 6 .  amLODipine (NORVASC) 10 MG tablet, TAKE 1 TABLET BY MOUTH ONCE DAILY., Disp: 30 tablet, Rfl: 6 .  D3 SUPER STRENGTH 2000 units CAPS, TAKE (1) CAPSULE BY MOUTH ONCE DAILY., Disp: 30 capsule, Rfl: 6 .  furosemide (LASIX) 40 MG tablet, TAKE 1 TABLET BY MOUTH TWICE DAILY (MORNING AND LUNCH), Disp: 60 tablet, Rfl: 3 .  latanoprost (XALATAN) 0.005 % ophthalmic solution, Place 1 drop into the right eye at bedtime. , Disp: , Rfl:  .  levothyroxine (SYNTHROID, LEVOTHROID) 50 MCG tablet, TAKE 1 TABLET BY MOUTH ONCE DAILY BEFORE BREAKFAST., Disp: 30 tablet, Rfl: 3 .  potassium chloride SA (K-DUR,KLOR-CON) 20 MEQ tablet, TAKE 1 TABLET BY MOUTH ONCE DAILY., Disp: 30 tablet, Rfl: 6 .  traZODone (DESYREL) 150 MG tablet, TAKE ONE TABLET BY MOUTH AT BEDTIME., Disp: 30 tablet, Rfl: 3  Review of Systems  Constitutional: Negative.   Cardiovascular: Negative.   Musculoskeletal:       Burning sensation in left lower leg  Skin: Positive for color change.    Social History   Tobacco Use  . Smoking status: Never Smoker  . Smokeless tobacco: Never Used  Substance Use Topics  . Alcohol use: No    Alcohol/week: 0.0 oz   Objective:   BP 116/60 (BP Location: Right Arm, Patient Position: Sitting, Cuff Size: Normal)  Pulse 66   Temp 97.8 F (36.6 C) (Oral)   Wt 174 lb (78.9 kg)   SpO2 95%   BMI 28.96 kg/m    Physical Exam  Constitutional: She is oriented to person, place, and time. She appears well-developed and well-nourished. No distress.  HENT:  Head: Normocephalic and atraumatic.  Right Ear: Hearing normal.  Left Ear: Hearing normal.  Nose: Nose normal.  Eyes: Conjunctivae and lids are normal. Right eye exhibits no discharge. Left eye exhibits no discharge. No scleral icterus.  Cardiovascular: Normal rate and regular rhythm.  Pulmonary/Chest: Effort normal. No respiratory distress.  Neurological: She  is alert and oriented to person, place, and time.  Skin: Skin is intact. No lesion and no rash noted.  Large area of ecchymosis on the left upper shin and serous blister distally.  Psychiatric: She has a normal mood and affect. Her speech is normal and behavior is normal. Thought content normal.      Assessment & Plan:     1. Contusion of left lower leg, initial encounter Caregiver noticed this bruise on 08-07-17 and probably happened over night. Likes to keep her walker next to the bed but denies known fall or injury. No open areas or fever. Minimal soreness. Will protect from further injury and will let us know if the serous blister opens. Using knee high hose for light compression. Elevate as much as possible. Keep follow up appointment in April (sooner if needed).       Dortha Kernennis Elton Catalano, PA  [facility]

## 2017-09-06 ENCOUNTER — Other Ambulatory Visit: Payer: Self-pay | Admitting: Family Medicine

## 2017-09-06 NOTE — Telephone Encounter (Signed)
Pharmacy requesting refills. Thanks!  

## 2017-09-11 DIAGNOSIS — H401414 Capsular glaucoma with pseudoexfoliation of lens, right eye, indeterminate stage: Secondary | ICD-10-CM | POA: Diagnosis not present

## 2017-09-12 ENCOUNTER — Telehealth: Payer: Self-pay | Admitting: Family Medicine

## 2017-09-12 MED ORDER — GUAIFENESIN-DM 100-10 MG/5ML PO SYRP: ORAL_SOLUTION | ORAL | 0 refills | Status: DC

## 2017-09-12 MED ORDER — DOXYCYCLINE HYCLATE 100 MG PO TABS: 100.0000 mg | ORAL_TABLET | Freq: Two times a day (BID) | ORAL | 0 refills | Status: DC

## 2017-09-12 NOTE — Telephone Encounter (Signed)
RX sent to [facility]. [facility].

## 2017-09-12 NOTE — Telephone Encounter (Signed)
Brandy Schroeder states the patient has a cold similar to what she had in December.  She is coughing up mucus.  States they would like clarification on what action to take.

## 2017-09-12 NOTE — Telephone Encounter (Signed)
Recommend using Robitussin-DM 1-2 teaspoon QID prn cough for 5-6 days 120 mL and Doxycycline 100 mg BID #20. If having fever or worsening, should schedule appointment in 5-6 days.

## 2017-09-15 ENCOUNTER — Telehealth: Payer: Self-pay | Admitting: Family Medicine

## 2017-09-15 ENCOUNTER — Telehealth: Payer: Self-pay

## 2017-09-15 NOTE — Telephone Encounter (Signed)
Tried to contact pt to set up AWV. Spoke with receptionist at [name] who stated she would have to tell the supervisor at the facility about this because she will set up apt and attend with pt.  -MM

## 2017-09-15 NOTE — Telephone Encounter (Signed)
Brandy Schroeder would like [name] to know the patient has been having nightmare the past few nights since she started taking the antibiotic.  Brandy Schroeder wants to know if this could be a side effect of the medication.

## 2017-09-15 NOTE — Telephone Encounter (Signed)
Tonya returned call and scheduled appt for pt on Monday 09/18/17 @ 11 am. Thanks TNP

## 2017-09-15 NOTE — Telephone Encounter (Signed)
This antibiotic should not be causing nightmares. Should schedule follow up appointment first of next week,

## 2017-09-18 ENCOUNTER — Ambulatory Visit (INDEPENDENT_AMBULATORY_CARE_PROVIDER_SITE_OTHER): Payer: Medicare Other | Admitting: Family Medicine

## 2017-09-18 ENCOUNTER — Encounter: Payer: Self-pay | Admitting: Family Medicine

## 2017-09-18 VITALS — BP 120/56 | HR 68 | Temp 97.5°F | Wt 171.4 lb

## 2017-09-18 DIAGNOSIS — N184 Chronic kidney disease, stage 4 (severe): Secondary | ICD-10-CM

## 2017-09-18 DIAGNOSIS — R5383 Other fatigue: Secondary | ICD-10-CM

## 2017-09-18 DIAGNOSIS — F515 Nightmare disorder: Secondary | ICD-10-CM | POA: Diagnosis not present

## 2017-09-18 NOTE — Progress Notes (Signed)
Patient: Brandy Schroeder Female    DOB: 1918/08/01   84 y.o.   MRN: [id number] Visit Date: 09/18/2017  Today's Provider: Dortha Kern, PA   Chief Complaint  Patient presents with  . Nightmares   Subjective:    HPI Patient presents today with [place] (employee at [facility]). Archie Patten reports patient was having nightmares and hallucinating while taking Doxycycline along with Robitussin. They stopped giving patient the Robitussin and symptoms improved. They are unsure if this could be a possible side effect of the combination of the two medications.   Past Medical History:  Diagnosis Date  . Anxiety   . Blind left eye   . CKD (chronic kidney disease)   . Depression   . Hypothyroidism   . OA (osteoarthritis)    Past Surgical History:  Procedure Laterality Date  . ABDOMINAL HYSTERECTOMY    . CHOLECYSTECTOMY    . EYE SURGERY     Family History  Problem Relation Age of Onset  . Arthritis Mother   . Stroke Mother   . Heart disease Brother   . Arthritis Brother    Allergies  Allergen Reactions  . Sulfa Antibiotics     Current Outpatient Medications:  .  acetaminophen (TYLENOL) 500 MG tablet, Take 500 mg by mouth every 6 (six) hours as needed., Disp: , Rfl:  .  Alpha-Lipoic Acid 300 MG CAPS, TAKE (1) CAPSULE BY MOUTH ONCE DAILY., Disp: 30 capsule, Rfl: 6 .  amLODipine (NORVASC) 10 MG tablet, TAKE 1 TABLET BY MOUTH ONCE DAILY., Disp: 30 tablet, Rfl: 6 .  D3 SUPER STRENGTH 2000 units CAPS, TAKE (1) CAPSULE BY MOUTH ONCE DAILY., Disp: 30 capsule, Rfl: 6 .  doxycycline (VIBRA-TABS) 100 MG tablet, Take 1 tablet (100 mg total) by mouth 2 (two) times daily., Disp: 20 tablet, Rfl: 0 .  furosemide (LASIX) 40 MG tablet, TAKE 1 TABLET BY MOUTH TWICE DAILY (MORNING AND LUNCH), Disp: 60 tablet, Rfl: 3 .  guaiFENesin-dextromethorphan (ROBITUSSIN DM) 100-10 MG/5ML syrup, Take 5-10 ML every 4 hours as needed for cough for 5-6 days, Disp: 118 mL, Rfl: 0 .  latanoprost (XALATAN) 0.005  % ophthalmic solution, Place 1 drop into the right eye at bedtime. , Disp: , Rfl:  .  levothyroxine (SYNTHROID, LEVOTHROID) 50 MCG tablet, TAKE 1 TABLET BY MOUTH ONCE DAILY BEFORE BREAKFAST., Disp: 30 tablet, Rfl: 3 .  potassium chloride SA (K-DUR,KLOR-CON) 20 MEQ tablet, TAKE 1 TABLET BY MOUTH ONCE DAILY., Disp: 30 tablet, Rfl: 6 .  traZODone (DESYREL) 150 MG tablet, TAKE ONE TABLET BY MOUTH AT BEDTIME., Disp: 30 tablet, Rfl: 3  Review of Systems  Constitutional: Negative.        Nightmares   Respiratory: Negative.   Cardiovascular: Negative.     Social History   Tobacco Use  . Smoking status: Never Smoker  . Smokeless tobacco: Never Used  Substance Use Topics  . Alcohol use: No    Alcohol/week: 0.0 oz   Objective:   BP (!) 120/56 (BP Location: Right Arm, Patient Position: Sitting, Cuff Size: Normal)   Pulse 68   Temp (!) 97.5 F (36.4 C) (Oral)   Wt 171 lb 6.4 oz (77.7 kg)   SpO2 92%   BMI 28.52 kg/m  Wt Readings from Last 3 Encounters:  09/18/17 171 lb 6.4 oz (77.7 kg)  08/10/17 174 lb (78.9 kg)  05/22/17 166 lb (75.3 kg)   Physical Exam  Constitutional: She is oriented to person, place, and time.  She appears well-developed and well-nourished. No distress.  HENT:  Head: Normocephalic and atraumatic.  Right Ear: Hearing and external ear normal.  Left Ear: Hearing and external ear normal.  Nose: Nose normal.  Mouth/Throat: Oropharynx is clear and moist.  Moderate hearing loss.  Eyes: Conjunctivae and lids are normal. Right eye exhibits no discharge. Left eye exhibits no discharge. No scleral icterus.  Neck: Neck supple.  Cardiovascular: Normal rate and regular rhythm.  Pulmonary/Chest: Effort normal and breath sounds normal. No respiratory distress.  Abdominal: Soft. Bowel sounds are normal.  Lymphadenopathy:    She has no cervical adenopathy.  Neurological: She is alert and oriented to person, place, and time.  Skin: Skin is intact. No lesion and no rash noted.    Psychiatric: She has a normal mood and affect. Her speech is normal and behavior is normal. Thought content normal.      Assessment & Plan:     1. Nightmares Woke up a couple times having nightmares/confusion while taking Robitussin-DM with Doxycycline for cough and congestion. Congestion has cleared and stopped taking the Robitussin-DM. No further nightmares/confusion and had 3-4 days of Doxycycline to finish. Encouraged to drink extra fluids and check routine labs. Recheck pending lab reports.  2. Fatigue, unspecified type Onset with cough and use of Robitussin-DM over the past week. Eating well and feeling much better since stopping the Robitussin-DM. Check labs and continue present routine medication regimen with diet. - CBC with Differential/Platelet - Comprehensive metabolic panel  3. CKD (chronic kidney disease), stage IV (HCC) With recent cough and fatigue with nightmares/confusion at night, will recheck CBC and CMP for worsening CKD, infection or dehydration. - CBC with Differential/Platelet - Comprehensive metabolic panel       Dortha Kernennis Chrismon, PA  [facility]

## 2017-09-19 LAB — COMPREHENSIVE METABOLIC PANEL
ALBUMIN: 4.2 g/dL (ref 3.2–4.6)
ALT: 11 IU/L (ref 0–32)
AST: 13 IU/L (ref 0–40)
Albumin/Globulin Ratio: 1.3 (ref 1.2–2.2)
Alkaline Phosphatase: 90 IU/L (ref 39–117)
BUN / CREAT RATIO: 18 (ref 12–28)
BUN: 26 mg/dL (ref 10–36)
Bilirubin Total: 0.2 mg/dL (ref 0.0–1.2)
CO2: 26 mmol/L (ref 20–29)
CREATININE: 1.46 mg/dL — AB (ref 0.57–1.00)
Calcium: 9.6 mg/dL (ref 8.7–10.3)
Chloride: 99 mmol/L (ref 96–106)
GFR calc non Af Amer: 30 mL/min/{1.73_m2} — ABNORMAL LOW (ref >59)
GFR, EST AFRICAN AMERICAN: 35 mL/min/{1.73_m2} — AB (ref >59)
GLOBULIN, TOTAL: 3.2 g/dL (ref 1.5–4.5)
GLUCOSE: 113 mg/dL — AB (ref 65–99)
Potassium: 5.1 mmol/L (ref 3.5–5.2)
Sodium: 142 mmol/L (ref 134–144)
TOTAL PROTEIN: 7.4 g/dL (ref 6.0–8.5)

## 2017-09-19 LAB — CBC WITH DIFFERENTIAL/PLATELET
BASOS ABS: 0 10*3/uL (ref 0.0–0.2)
Basos: 1 %
EOS (ABSOLUTE): 0.1 10*3/uL (ref 0.0–0.4)
EOS: 2 %
HEMATOCRIT: 37.8 % (ref 34.0–46.6)
HEMOGLOBIN: 12.5 g/dL (ref 11.1–15.9)
IMMATURE GRANS (ABS): 0.2 10*3/uL — AB (ref 0.0–0.1)
Immature Granulocytes: 4 %
LYMPHS: 19 %
Lymphocytes Absolute: 1.2 10*3/uL (ref 0.7–3.1)
MCH: 30 pg (ref 26.6–33.0)
MCHC: 33.1 g/dL (ref 31.5–35.7)
MCV: 91 fL (ref 79–97)
MONOCYTES: 7 %
Monocytes Absolute: 0.4 10*3/uL (ref 0.1–0.9)
NEUTROS ABS: 4.4 10*3/uL (ref 1.4–7.0)
Neutrophils: 67 %
Platelets: 341 10*3/uL (ref 150–379)
RBC: 4.16 x10E6/uL (ref 3.77–5.28)
RDW: 14.9 % (ref 12.3–15.4)
WBC: 6.1 10*3/uL (ref 3.4–10.8)

## 2017-09-25 ENCOUNTER — Telehealth: Payer: Self-pay | Admitting: Family Medicine

## 2017-09-25 NOTE — Telephone Encounter (Signed)
Brandy Schroeder from [facility] states that the pharmacy told her that pt does not need her refill on her potassium chloride SA (K-DUR,KLOR-CON) 20 MEQ tablet due to her last lab work being good.  She needs clarification if she needs this.  If she does still need to take this she needs a refill order and if she does not need this medication any longer she needs a D/C order faxed to [phone].  She uses [facility].

## 2017-09-25 NOTE — Telephone Encounter (Signed)
Send order to discontinue Potassium. Blood level on 09-18-17 was the top end of normal. Will keep up with the level as needed with blood tests.

## 2017-09-25 NOTE — Telephone Encounter (Signed)
[facility] at [phone]

## 2017-10-04 ENCOUNTER — Other Ambulatory Visit: Payer: Self-pay | Admitting: Family Medicine

## 2017-10-04 DIAGNOSIS — R609 Edema, unspecified: Secondary | ICD-10-CM

## 2017-11-03 ENCOUNTER — Ambulatory Visit: Payer: Medicare Other | Admitting: Family Medicine

## 2017-11-06 ENCOUNTER — Encounter: Payer: Self-pay | Admitting: Family Medicine

## 2017-11-06 ENCOUNTER — Telehealth: Payer: Self-pay | Admitting: Family Medicine

## 2017-11-06 ENCOUNTER — Ambulatory Visit (INDEPENDENT_AMBULATORY_CARE_PROVIDER_SITE_OTHER): Payer: Medicare Other | Admitting: Family Medicine

## 2017-11-06 VITALS — BP 124/58 | HR 64 | Temp 97.4°F | Wt 176.8 lb

## 2017-11-06 DIAGNOSIS — R609 Edema, unspecified: Secondary | ICD-10-CM

## 2017-11-06 DIAGNOSIS — B351 Tinea unguium: Secondary | ICD-10-CM | POA: Diagnosis not present

## 2017-11-06 DIAGNOSIS — F418 Other specified anxiety disorders: Secondary | ICD-10-CM

## 2017-11-06 DIAGNOSIS — E039 Hypothyroidism, unspecified: Secondary | ICD-10-CM | POA: Diagnosis not present

## 2017-11-06 DIAGNOSIS — L84 Corns and callosities: Secondary | ICD-10-CM

## 2017-11-06 DIAGNOSIS — F5105 Insomnia due to other mental disorder: Secondary | ICD-10-CM

## 2017-11-06 DIAGNOSIS — N184 Chronic kidney disease, stage 4 (severe): Secondary | ICD-10-CM | POA: Diagnosis not present

## 2017-11-06 NOTE — Telephone Encounter (Signed)
[name] with [name] stated she was returning [place]'s call and request a call back. CB# [phone]. Please advise. Thanks TNP

## 2017-11-06 NOTE — Progress Notes (Signed)
Patient: Brandy Schroeder Female    DOB: 15-Sep-1918   84 y.o.   MRN: [id number] Visit Date: 11/06/2017  Today's Provider: Vernie Murders, PA   Chief Complaint  Patient presents with  . Follow-up   Subjective:    HPI  Chronic Kidney Disease:  Patient is here for 3 months follow up. Last office visit was on 05/22/17 for a routine check up. At that time lab work was done: Met C and CBC results showed kidney function has some strain again. Potassium level was normal. Patient advised to recheck labs in 2 months. On 08/03/17 and 09/18/17 labs showed some slight improvement in kidney function tests. No medication changes were made at that time. Patient advised to increase fluid intake. Patient is doing fairly well with fluid intake.    Edema: patient is taking Lasix 40 mg twice daily. Weight has been fluctuating. Patient denies chest pain, shortness of breath or wheezing.  Wt Readings from Last 3 Encounters:  11/06/17 176 lb 12.8 oz (80.2 kg)  09/18/17 171 lb 6.4 oz (77.7 kg)  08/10/17 174 lb (78.9 kg)    Insomnia:  Patient is sleeping well, she is taking Trazodone 150 mg at bedtime each night.  HTN: Patient has her blood pressure checked and readings are varying between 120's/60's.  No cardiac symptoms. Patient denies dizziness.   BP Readings from Last 3 Encounters:  11/06/17 (!) 124/58  09/18/17 (!) 120/56  08/10/17 116/60    Allergies  Allergen Reactions  . Sulfa Antibiotics      Current Outpatient Medications:  .  acetaminophen (TYLENOL) 500 MG tablet, Take 500 mg by mouth every 6 (six) hours as needed., Disp: , Rfl:  .  Alpha-Lipoic Acid 300 MG CAPS, TAKE (1) CAPSULE BY MOUTH ONCE DAILY., Disp: 30 capsule, Rfl: 6 .  amLODipine (NORVASC) 10 MG tablet, TAKE 1 TABLET BY MOUTH ONCE DAILY., Disp: 30 tablet, Rfl: 6 .  D3 SUPER STRENGTH 2000 units CAPS, TAKE (1) CAPSULE BY MOUTH ONCE DAILY., Disp: 30 capsule, Rfl: 6 .  furosemide (LASIX) 40 MG tablet, TAKE 1 TABLET  BY MOUTH TWICE DAILY (MORNING AND LUNCH), Disp: 60 tablet, Rfl: 2 .  latanoprost (XALATAN) 0.005 % ophthalmic solution, Place 1 drop into the right eye at bedtime. , Disp: , Rfl:  .  levothyroxine (SYNTHROID, LEVOTHROID) 50 MCG tablet, TAKE 1 TABLET BY MOUTH ONCE DAILY BEFORE BREAKFAST., Disp: 30 tablet, Rfl: 2 .  traZODone (DESYREL) 150 MG tablet, TAKE ONE TABLET BY MOUTH AT BEDTIME., Disp: 30 tablet, Rfl: 3 .  potassium chloride SA (K-DUR,KLOR-CON) 20 MEQ tablet, TAKE 1 TABLET BY MOUTH ONCE DAILY. (Patient not taking: Reported on 11/06/2017), Disp: 30 tablet, Rfl: 6  Review of Systems  Constitutional: Negative.   Respiratory: Negative.   Cardiovascular: Negative.   Skin:       Corn on right foot     Social History   Tobacco Use  . Smoking status: Never Smoker  . Smokeless tobacco: Never Used  Substance Use Topics  . Alcohol use: No    Alcohol/week: 0.0 oz   Objective:   BP (!) 124/58 (BP Location: Right Arm, Patient Position: Sitting, Cuff Size: Normal)   Pulse 64   Temp (!) 97.4 F (36.3 C) (Oral)   Wt 176 lb 12.8 oz (80.2 kg)   SpO2 97%   BMI 29.42 kg/m  Wt Readings from Last 3 Encounters:  11/06/17 176 lb 12.8 oz (80.2 kg)  09/18/17 171 lb 6.4  oz (77.7 kg)  08/10/17 174 lb (78.9 kg)   Physical Exam  Constitutional: She is oriented to person, place, and time. She appears well-developed and well-nourished. No distress.  HENT:  Head: Normocephalic and atraumatic.  Right Ear: Hearing normal.  Left Ear: Hearing normal.  Nose: Nose normal.  Eyes: Conjunctivae and lids are normal. Right eye exhibits no discharge. Left eye exhibits no discharge. No scleral icterus.  Neck: Normal range of motion.  Cardiovascular: Normal rate and regular rhythm.  Pulmonary/Chest: Effort normal. No respiratory distress.  Abdominal: Soft. Bowel sounds are normal.  Musculoskeletal: She exhibits edema.  Left leg shorter than right - wears built-up shoe. 3+ pitting edema both lower legs up to  mid-calf region. Large tender corn on the lateral side of the right 5th toe. Large irregular right toenail.  Neurological: She is alert and oriented to person, place, and time.  Skin: Skin is intact. No lesion and no rash noted.  Psychiatric: She has a normal mood and affect. Her speech is normal and behavior is normal. Thought content normal.      Assessment & Plan:     1. Edema, peripheral Caregiver has noticed some weight gain (5 lbs in the past 2 months) and more peripheral edema. States this happens more when the patient travels more with family and does not elevate her legs frequent enough. Still getting Lasix 40 mg breakfast and lunch. Continues to restrict salt intake. Recheck CBC and CMP. May need a little boost with Zaroxolyn 2.5 mg qd for 3 days if kidney function allows. Recheck pending lab reports. - CBC with Differential/Platelet - Comprehensive metabolic panel  2. CKD (chronic kidney disease), stage IV ([place]) Last renal function a month ago showed creatinine 1.46 with K+ 5.1 and Ca+ 9.6. Will recheck CBC and CMP.  Eating well and no over active bladder symptoms but has chronic peripheral edema. - CBC with Differential/Platelet - Comprehensive metabolic panel  3. Adult hypothyroidism Still taking Levothyroxine 50 mcg qd. Recheck T4 and TSH. - T4 - TSH  4. Insomnia secondary to depression with anxiety Sleeping well and no anxiety or mood swings with using the Trazodone 150 mg hs.   5. Corn of toe Painful corn on the lateral right great toe. Schedule podiatry referral. - Ambulatory referral to Podiatry  6. Onychomycosis of toenail Thick but loose right great toenail without known injury. Wears specialize shoes with large toe box and elevated sole on the left due to unequal leg length. Schedule podiatry referral. - Ambulatory referral to [place]

## 2017-11-07 ENCOUNTER — Telehealth: Payer: Self-pay | Admitting: Family Medicine

## 2017-11-07 LAB — COMPREHENSIVE METABOLIC PANEL
A/G RATIO: 1.5 (ref 1.2–2.2)
ALT: 14 IU/L (ref 0–32)
AST: 16 IU/L (ref 0–40)
Albumin: 4.2 g/dL (ref 3.2–4.6)
Alkaline Phosphatase: 81 IU/L (ref 39–117)
BILIRUBIN TOTAL: 0.3 mg/dL (ref 0.0–1.2)
BUN/Creatinine Ratio: 21 (ref 12–28)
BUN: 28 mg/dL (ref 10–36)
CALCIUM: 9.5 mg/dL (ref 8.7–10.3)
CHLORIDE: 101 mmol/L (ref 96–106)
CO2: 26 mmol/L (ref 20–29)
Creatinine, Ser: 1.36 mg/dL — ABNORMAL HIGH (ref 0.57–1.00)
GFR calc Af Amer: 38 mL/min/{1.73_m2} — ABNORMAL LOW (ref >59)
GFR calc non Af Amer: 33 mL/min/{1.73_m2} — ABNORMAL LOW (ref >59)
GLUCOSE: 93 mg/dL (ref 65–99)
Globulin, Total: 2.8 g/dL (ref 1.5–4.5)
POTASSIUM: 4.3 mmol/L (ref 3.5–5.2)
Sodium: 145 mmol/L — ABNORMAL HIGH (ref 134–144)
Total Protein: 7 g/dL (ref 6.0–8.5)

## 2017-11-07 LAB — CBC WITH DIFFERENTIAL/PLATELET
BASOS: 0 %
Basophils Absolute: 0 10*3/uL (ref 0.0–0.2)
EOS (ABSOLUTE): 0.1 10*3/uL (ref 0.0–0.4)
Eos: 1 %
Hematocrit: 38.6 % (ref 34.0–46.6)
Hemoglobin: 12.4 g/dL (ref 11.1–15.9)
IMMATURE GRANULOCYTES: 0 %
Immature Grans (Abs): 0 10*3/uL (ref 0.0–0.1)
Lymphocytes Absolute: 1 10*3/uL (ref 0.7–3.1)
Lymphs: 19 %
MCH: 30.2 pg (ref 26.6–33.0)
MCHC: 32.1 g/dL (ref 31.5–35.7)
MCV: 94 fL (ref 79–97)
MONOS ABS: 0.5 10*3/uL (ref 0.1–0.9)
Monocytes: 8 %
NEUTROS PCT: 72 %
Neutrophils Absolute: 4 10*3/uL (ref 1.4–7.0)
PLATELETS: 219 10*3/uL (ref 150–379)
RBC: 4.11 x10E6/uL (ref 3.77–5.28)
RDW: 15.5 % — AB (ref 12.3–15.4)
WBC: 5.6 10*3/uL (ref 3.4–10.8)

## 2017-11-07 LAB — TSH: TSH: 12.87 u[IU]/mL — AB (ref 0.450–4.500)

## 2017-11-07 LAB — T4: T4, Total: 8.3 ug/dL (ref 4.5–12.0)

## 2017-11-07 NOTE — Telephone Encounter (Signed)
Ok to send order for the patient to take Zaroxylone 2.5mg  qd for 3-[facility] days to [facility]? Please advise. Thanks!

## 2017-11-07 NOTE — Telephone Encounter (Signed)
Brandy Schroeder with [name] stated they received a voice message about a verbal message to take a medication. Brandy Schroeder stated that she wasn't sure which medication it was for because she didn't hear the message. Brandy Schroeder is requesting a written order to be faxed [phone]. Please advise. Thanks TNP

## 2017-11-07 NOTE — Telephone Encounter (Signed)
Zaroxolyn 2.5 mg qd for 3 days for edema/swelling of legs.

## 2017-11-07 NOTE — Telephone Encounter (Signed)
Order written and placed on TuletaDennis desk to sign.

## 2017-11-09 NOTE — Telephone Encounter (Signed)
Signed order and should have been faxed back. Don't see a copy in the chart yet.

## 2017-11-14 ENCOUNTER — Encounter: Payer: Self-pay | Admitting: Podiatry

## 2017-11-14 ENCOUNTER — Ambulatory Visit (INDEPENDENT_AMBULATORY_CARE_PROVIDER_SITE_OTHER): Payer: Medicare Other | Admitting: Podiatry

## 2017-11-14 DIAGNOSIS — L84 Corns and callosities: Secondary | ICD-10-CM | POA: Diagnosis not present

## 2017-11-14 DIAGNOSIS — M79676 Pain in unspecified toe(s): Secondary | ICD-10-CM

## 2017-11-14 DIAGNOSIS — B351 Tinea unguium: Secondary | ICD-10-CM

## 2017-11-16 NOTE — Progress Notes (Signed)
    Subjective: Patient is a 84 y.o. female presenting to the office today with a chief complaint of a painful callus lesion to the right fifth toe that has been present for the past 3-4 months. Wearing certain shoes and applying pressure to the area increases the pain. She has tried using OTC corn pads with no significant relief of the symptoms.   Patient also complains of elongated, thickened great toenails bilateral that cause pain while ambulating in shoes. She is unable to trim her own nails. Patient presents today for further treatment and evaluation.  Past Medical History:  Diagnosis Date  . Anxiety   . Blind left eye   . CKD (chronic kidney disease)   . Depression   . Hypothyroidism   . OA (osteoarthritis)     Objective:  Physical Exam General: Alert and oriented x3 in no acute distress  Dermatology: Hyperkeratotic lesion present on the right fifth toe. Pain on palpation with a central nucleated core noted. Skin is warm, dry and supple bilateral lower extremities. Negative for open lesions or macerations. Bilateral great toenails are tender, long, thickened and dystrophic with subungual debris, consistent with onychomycosis. No signs of infection noted.  Vascular: Palpable pedal pulses bilaterally. No edema or erythema noted. Capillary refill within normal limits.  Neurological: Epicritic and protective threshold grossly intact bilaterally.   Musculoskeletal Exam: Pain on palpation at the keratotic lesion noted. Range of motion within normal limits bilateral. Muscle strength 5/5 in all groups bilateral. There is a limb length discrepancy noted with the left lower extremity approximately 4 cm shorter than the right lower extremity.   Assessment: 1. limb length discrepancy-left lower extremity 4 cm shorter than right lower extremity 2. Dystrophic/painful nails bilateral great toes 3. Porokeratosis right 5th toe   Plan of Care:  1. Patient evaluated. 2. Excisional  debridement of keratoic lesion using a chisel blade was performed without incident.  3. Dressed with light dressing. 4. Mechanical debridement of nails 1-5 bilaterally performed using a nail nipper. Filed with dremel without incident.  5. Continue wearing custom shoes to accommodate LLD. Patient is to return to the clinic in 3 months.   Felecia ShellingBrent M. Evans, DPM [facility]  Dr. Felecia ShellingBrent M. Evans, DPM    [address]                                        [place], [place] [address]                Office [phone]  Fax [phone]

## 2017-11-24 ENCOUNTER — Telehealth: Payer: Self-pay | Admitting: Family Medicine

## 2017-11-24 DIAGNOSIS — R197 Diarrhea, unspecified: Secondary | ICD-10-CM

## 2017-11-24 MED ORDER — LOPERAMIDE HCL 2 MG PO TABS: 2.0000 mg | ORAL_TABLET | Freq: Three times a day (TID) | ORAL | 0 refills | Status: DC | PRN

## 2017-11-24 NOTE — Telephone Encounter (Signed)
Please review. Thanks!  

## 2017-11-24 NOTE — Telephone Encounter (Signed)
Has been having  Some diarrhea the last couple days.   They would like an order for imodium or something to help or advise if no med.  They use [employer] back is [phone]

## 2017-11-24 NOTE — Telephone Encounter (Signed)
[facility] care giver reports patient having diarrhea a couple time the past 2 days. No nausea or vomiting and no stomach cramps. Encouraged to increase fluid intake and may use Imodium 2-3 times a day prn. If persistent beyond 2 days, will need to schedule appointment here or go to ER for evaluation for dehydration. Caregiver agrees with plan.

## 2017-12-22 ENCOUNTER — Telehealth: Payer: Self-pay | Admitting: Family Medicine

## 2017-12-22 DIAGNOSIS — R05 Cough: Secondary | ICD-10-CM

## 2017-12-22 DIAGNOSIS — R059 Cough, unspecified: Secondary | ICD-10-CM

## 2017-12-22 MED ORDER — GUAIFENESIN-DM 100-10 MG/5ML PO SYRP: 5.0000 mL | ORAL_SOLUTION | ORAL | 0 refills | Status: DC | PRN

## 2017-12-22 NOTE — Telephone Encounter (Signed)
Advise Brandy Schroeder prescription sent to the pharmacy and be sure to check Brandy Schroeder for fever or signs of infection. If cough no better in 5 days, she should be scheduled to appointment.

## 2017-12-22 NOTE — Telephone Encounter (Signed)
[facility] with note attached stating [facility].

## 2017-12-22 NOTE — Telephone Encounter (Signed)
Archie Patten at [facility] said Mrs. Honeyman has a cough and needs a rx for the Robitussin DM sent to [facility].  [employer] call back is [phone]  Barth Kirks

## 2017-12-22 NOTE — Telephone Encounter (Signed)
Caregivers at [facility] reports patient has started a cough and requests prescription for the Robitussin-DM. Advised to check temperature for fever and signs of infection. If present, or cough persists, should recheck in the office in 5 days.

## 2017-12-25 ENCOUNTER — Other Ambulatory Visit: Payer: Self-pay | Admitting: Family Medicine

## 2017-12-25 DIAGNOSIS — R609 Edema, unspecified: Secondary | ICD-10-CM

## 2017-12-28 ENCOUNTER — Telehealth: Payer: Self-pay | Admitting: Family Medicine

## 2017-12-28 NOTE — Telephone Encounter (Signed)
[name] with [name] stated that pt's order for guaiFENesin-dextromethorphan (ROBITUSSIN DM) 100-10 MG/5ML syrup ran out on 12/26/17 and that was the last time pt was given the medication. [name] stated that pt's cough has improved but she is still coughing up clear fluid. [name] stated that pt's cough is worse when she is sitting in her reclining chair or at night during sleep. [name] wanted to know if they should start giving pt the guaiFENesin-dextromethorphan (ROBITUSSIN DM) 100-10 MG/5ML syrup again and if so they need another order. Please advise. Thanks TNP

## 2017-12-28 NOTE — Telephone Encounter (Signed)
[facility]. She states she will advise [name] when she returns to office.

## 2017-12-28 NOTE — Telephone Encounter (Signed)
Should schedule appointment to check lungs and probably get an x-ray of chest.

## 2017-12-29 ENCOUNTER — Ambulatory Visit: Payer: Medicare Other | Admitting: Family Medicine

## 2017-12-29 ENCOUNTER — Ambulatory Visit
Admission: RE | Admit: 2017-12-29 | Discharge: 2017-12-29 | Disposition: A | Discharge status: Home / Self Care | Payer: Medicare Other | Source: Ambulatory Visit | Attending: Family Medicine | Admitting: Family Medicine

## 2017-12-29 ENCOUNTER — Ambulatory Visit (INDEPENDENT_AMBULATORY_CARE_PROVIDER_SITE_OTHER): Payer: Medicare Other | Admitting: Family Medicine

## 2017-12-29 ENCOUNTER — Encounter: Payer: Self-pay | Admitting: Family Medicine

## 2017-12-29 ENCOUNTER — Telehealth: Payer: Self-pay

## 2017-12-29 VITALS — BP 116/58 | HR 75 | Temp 98.8°F | Wt 168.0 lb

## 2017-12-29 DIAGNOSIS — R609 Edema, unspecified: Secondary | ICD-10-CM

## 2017-12-29 DIAGNOSIS — J40 Bronchitis, not specified as acute or chronic: Secondary | ICD-10-CM | POA: Diagnosis not present

## 2017-12-29 DIAGNOSIS — R600 Localized edema: Secondary | ICD-10-CM

## 2017-12-29 DIAGNOSIS — N184 Chronic kidney disease, stage 4 (severe): Secondary | ICD-10-CM

## 2017-12-29 DIAGNOSIS — R0902 Hypoxemia: Secondary | ICD-10-CM | POA: Insufficient documentation

## 2017-12-29 DIAGNOSIS — R05 Cough: Secondary | ICD-10-CM | POA: Diagnosis not present

## 2017-12-29 DIAGNOSIS — J449 Chronic obstructive pulmonary disease, unspecified: Secondary | ICD-10-CM | POA: Diagnosis not present

## 2017-12-29 MED ORDER — DM-GUAIFENESIN ER 30-600 MG PO TB12: 1.0000 | ORAL_TABLET | Freq: Two times a day (BID) | ORAL | 0 refills | Status: DC

## 2017-12-29 MED ORDER — DOXYCYCLINE HYCLATE 100 MG PO TABS: 100.0000 mg | ORAL_TABLET | Freq: Two times a day (BID) | ORAL | 0 refills | Status: DC

## 2017-12-29 NOTE — Telephone Encounter (Signed)
-----   Message from Tamsen Roersennis E Chrismon, [place] sent at 12/29/2017  1:55 PM EDT ----- Chest x-ray shows COPD with some atelectasis. Recommend Doxycycline 100 mg BID #20 and Mucinex-DM (generic) BID #20. Schedule follow up appointment in 1 week. Awaiting lab test results.

## 2017-12-29 NOTE — Progress Notes (Signed)
Patient: Brandy Schroeder Female    DOB: 12/05/1918   84 y.o.   MRN: [id number] Visit Date: 12/29/2017  Today's Provider: Dortha Kernennis Dashiell Franchino, PA   Chief Complaint  Patient presents with  . Cough   Subjective:    Cough  This is a new problem. Episode onset: approximately 12/20/17. The problem has been waxing and waning. The cough is productive of sputum. Associated symptoms include shortness of breath. Associated symptoms comments: Congestion . Exacerbated by: sitting in recliner and at night. She has tried prescription cough suppressant (prescribed on 12/22/17, but patient finished RX) for the symptoms. The treatment provided moderate relief.   Past Medical History:  Diagnosis Date  . Anxiety   . Blind left eye   . CKD (chronic kidney disease)   . Depression   . Hypothyroidism   . OA (osteoarthritis)    Past Surgical History:  Procedure Laterality Date  . ABDOMINAL HYSTERECTOMY    . CHOLECYSTECTOMY    . EYE SURGERY     Family History  Problem Relation Age of Onset  . Arthritis Mother   . Stroke Mother   . Heart disease Brother   . Arthritis Brother    Allergies  Allergen Reactions  . Sulfa Antibiotics     Current Outpatient Medications:  .  acetaminophen (TYLENOL) 500 MG tablet, Take 500 mg by mouth every 6 (six) hours as needed., Disp: , Rfl:  .  Alpha-Lipoic Acid 300 MG CAPS, TAKE (1) CAPSULE BY MOUTH ONCE DAILY., Disp: 30 capsule, Rfl: 6 .  amLODipine (NORVASC) 10 MG tablet, TAKE 1 TABLET BY MOUTH ONCE DAILY., Disp: 30 tablet, Rfl: 6 .  D3 SUPER STRENGTH 2000 units CAPS, TAKE (1) CAPSULE BY MOUTH ONCE DAILY., Disp: 30 capsule, Rfl: 6 .  furosemide (LASIX) 40 MG tablet, TAKE 1 TABLET BY MOUTH TWICE DAILY (MORNING AND LUNCH), Disp: 180 tablet, Rfl: 1 .  latanoprost (XALATAN) 0.005 % ophthalmic solution, Place 1 drop into the right eye at bedtime. , Disp: , Rfl:  .  levothyroxine (SYNTHROID, LEVOTHROID) 50 MCG tablet, TAKE 1 TABLET BY MOUTH ONCE DAILY BEFORE  BREAKFAST., Disp: 90 tablet, Rfl: 3 .  loperamide (IMODIUM A-D) 2 MG tablet, Take 1 tablet (2 mg total) by mouth 3 (three) times daily as needed for diarrhea or loose stools., Disp: 12 tablet, Rfl: 0 .  traZODone (DESYREL) 150 MG tablet, TAKE ONE TABLET BY MOUTH AT BEDTIME., Disp: 90 tablet, Rfl: 1 .  guaiFENesin-dextromethorphan (ROBITUSSIN DM) 100-10 MG/5ML syrup, Take 5 mLs by mouth every 4 (four) hours as needed for cough. Treat for 5 days then discontinue. (Patient not taking: Reported on 12/29/2017), Disp: 118 mL, Rfl: 0  Review of Systems  Constitutional: Negative.   HENT: Positive for congestion.   Respiratory: Positive for cough and shortness of breath.   Cardiovascular: Negative.    Social History   Tobacco Use  . Smoking status: Never Smoker  . Smokeless tobacco: Never Used  Substance Use Topics  . Alcohol use: No    Alcohol/week: 0.0 oz   Objective:   BP (!) 116/58 (BP Location: Right Arm, Patient Position: Sitting, Cuff Size: Normal)   Pulse 75   Temp 98.8 F (37.1 C) (Oral)   Wt 168 lb (76.2 kg) Comment: [facility] by [name]  SpO2 (!) 88%   BMI 27.96 kg/m  Wt Readings from Last 3 Encounters:  12/29/17 168 lb (76.2 kg)  11/06/17 176 lb 12.8 oz (80.2 kg)  09/18/17 171 lb 6.4  oz (77.7 kg)   Physical Exam  Constitutional: She is oriented to person, place, and time. She appears well-developed and well-nourished. No distress.  HENT:  Head: Normocephalic and atraumatic.  Right Ear: Hearing normal.  Left Ear: Hearing normal.  Nose: Nose normal.  Eyes: Conjunctivae and lids are normal. Right eye exhibits no discharge. Left eye exhibits no discharge. No scleral icterus.  Cardiovascular: Normal rate and regular rhythm.  Pulmonary/Chest: Effort normal and breath sounds normal. No respiratory distress.  Few rhonchi intermittently.  Abdominal: Soft. Bowel sounds are normal.  Musculoskeletal: She exhibits edema.  Peripheral edema in lower legs.   Neurological: She is  alert and oriented to person, place, and time.  Skin: Skin is intact. No lesion and no rash noted.  Psychiatric: She has a normal mood and affect. Her speech is normal and behavior is normal. Thought content normal.      Assessment & Plan:     1. Bronchitis Onset of cough on 12-20-17. Caregiver denies fever, complaint of chest pain but noticed some shortness of breath after exertion from an outing with family. Few rhonchi intermittently in upper lung fields today. No wheeze or dyspnea at rest. Will check CBC, CMP and CXR to rule out pneumonia, CHF and worsening of CKD. May need to go to the hospital pending reports with low pulse ox today. - CBC with Differential/Platelet - Comprehensive metabolic panel - DG Chest 2 View  2. Hypoxia Pulse oximetry 88% today but no dyspnea. With recent cough and congestion, need to check labs and CXR to rule out pneumonia versus CHF. Caregiver states she has had some clear sputum production and questionable dyspnea when she tries to exert herself during a trip with family yesterday. - CBC with Differential/Platelet - Comprehensive metabolic panel - DG Chest 2 View  3. CKD (chronic kidney disease), stage IV (HCC) Creatinine was 1.36 with GFR 33 on 11-06-17. Edema improved with diuresis. Recheck labs and follow up pending reports. - CBC with Differential/Platelet - Comprehensive metabolic panel  4. Edema, peripheral Continues to have 2-3+ peripheral edema in lower legs and feet. No dyspnea and caregiver has seen 8 lb weight loss since taking Lasix to 40 mg BID and adding a 3 day boost with Zaroxolyn 2.5 mg on 11-06-17. Denies palpitations or chest pains. Check labs and CXR to rule out pneumonia versus CHF or worsening CKD. - CBC with Differential/Platelet - Comprehensive metabolic panel - DG Chest 2 View       Dortha Kern, [place]  [facility]

## 2017-12-29 NOTE — Telephone Encounter (Signed)
[facility] with [facility]. [facility]. [facility] at [phone]. [name] will call back to schedule a follow up appointment.

## 2017-12-30 ENCOUNTER — Telehealth: Payer: Self-pay | Admitting: Family Medicine

## 2017-12-30 LAB — COMPREHENSIVE METABOLIC PANEL
A/G RATIO: 1.4 (ref 1.2–2.2)
ALBUMIN: 4 g/dL (ref 3.2–4.6)
ALK PHOS: 84 IU/L (ref 39–117)
ALT: 10 IU/L (ref 0–32)
AST: 11 IU/L (ref 0–40)
BUN / CREAT RATIO: 16 (ref 12–28)
BUN: 25 mg/dL (ref 10–36)
Bilirubin Total: 0.3 mg/dL (ref 0.0–1.2)
CO2: 24 mmol/L (ref 20–29)
Calcium: 9.3 mg/dL (ref 8.7–10.3)
Chloride: 100 mmol/L (ref 96–106)
Creatinine, Ser: 1.55 mg/dL — ABNORMAL HIGH (ref 0.57–1.00)
GFR calc non Af Amer: 28 mL/min/{1.73_m2} — ABNORMAL LOW (ref >59)
GFR, EST AFRICAN AMERICAN: 32 mL/min/{1.73_m2} — AB (ref >59)
GLUCOSE: 103 mg/dL — AB (ref 65–99)
Globulin, Total: 2.8 g/dL (ref 1.5–4.5)
POTASSIUM: 4 mmol/L (ref 3.5–5.2)
Sodium: 141 mmol/L (ref 134–144)
Total Protein: 6.8 g/dL (ref 6.0–8.5)

## 2017-12-30 LAB — CBC WITH DIFFERENTIAL/PLATELET
BASOS: 0 %
Basophils Absolute: 0 10*3/uL (ref 0.0–0.2)
EOS (ABSOLUTE): 0.1 10*3/uL (ref 0.0–0.4)
EOS: 1 %
HEMATOCRIT: 37.5 % (ref 34.0–46.6)
Hemoglobin: 12.5 g/dL (ref 11.1–15.9)
Immature Grans (Abs): 0.1 10*3/uL (ref 0.0–0.1)
Immature Granulocytes: 1 %
Lymphocytes Absolute: 1.3 10*3/uL (ref 0.7–3.1)
Lymphs: 12 %
MCH: 30.1 pg (ref 26.6–33.0)
MCHC: 33.3 g/dL (ref 31.5–35.7)
MCV: 90 fL (ref 79–97)
MONOS ABS: 0.8 10*3/uL (ref 0.1–0.9)
Monocytes: 8 %
NEUTROS ABS: 8.3 10*3/uL — AB (ref 1.4–7.0)
Neutrophils: 78 %
Platelets: 295 10*3/uL (ref 150–450)
RBC: 4.15 x10E6/uL (ref 3.77–5.28)
RDW: 14.9 % (ref 12.3–15.4)
WBC: 10.6 10*3/uL (ref 3.4–10.8)

## 2017-12-30 NOTE — Telephone Encounter (Signed)
Can you please review lab results?

## 2017-12-30 NOTE — Telephone Encounter (Signed)
This is Brandy Schroeder's patient..... Patient's caregiver, Brandy Schroeder, called wanting lab results and to talk to someone in depth about her chest xray results.  Pts pulse ox was 86% this morning and she is running low grade fever.

## 2017-12-30 NOTE — Telephone Encounter (Signed)
Lab results are: Stable kidney function. Normal electrolytes.  Normal blood counts.  Also looked at CXR and agree with Dennis's management as discussed with caregiver yesterday  Beryle FlockBacigalupo, Marzella SchleinAngela M, MD, MPH [facility] 12/30/2017 10:13 AM

## 2017-12-30 NOTE — Telephone Encounter (Signed)
[name] advised as below and agrees with plan.

## 2018-01-05 ENCOUNTER — Encounter: Payer: Self-pay | Admitting: Family Medicine

## 2018-01-05 ENCOUNTER — Ambulatory Visit (INDEPENDENT_AMBULATORY_CARE_PROVIDER_SITE_OTHER): Payer: Medicare Other | Admitting: Family Medicine

## 2018-01-05 VITALS — BP 116/60 | HR 72 | Wt 169.0 lb

## 2018-01-05 DIAGNOSIS — J40 Bronchitis, not specified as acute or chronic: Secondary | ICD-10-CM

## 2018-01-05 DIAGNOSIS — R0902 Hypoxemia: Secondary | ICD-10-CM

## 2018-01-05 NOTE — Progress Notes (Signed)
Patient: Brandy Schroeder Female    DOB: 1919-04-16   84 y.o.   MRN: [id number] Visit Date: 01/05/2018  Today's Provider: Dortha Kern, PA   Chief Complaint  Patient presents with  . Bronchitis    follow up    Subjective:    HPI Bronchitis with atelectasis:   Patient presents for a 1 week follow up. Last OV was on 12/29/17. Labs were stable. CXR showed bronchitis with some atelectasis. Patient started Doxycycline 100 mg and advised to use Mucinex DM and follow up in 1 week. Patient report good compliance with treatment plan. Symptoms are gradually improving. She reports productive cough, nasal congestion, and SOB on exertion but symptoms are improved. Patient's O2 saturation levels have been running 91-92%.  Past Medical History:  Diagnosis Date  . Anxiety   . Blind left eye   . CKD (chronic kidney disease)   . Depression   . Hypothyroidism   . OA (osteoarthritis)    Past Surgical History:  Procedure Laterality Date  . ABDOMINAL HYSTERECTOMY    . CHOLECYSTECTOMY    . EYE SURGERY     Family History  Problem Relation Age of Onset  . Arthritis Mother   . Stroke Mother   . Heart disease Brother   . Arthritis Brother    Allergies  Allergen Reactions  . Sulfa Antibiotics     Current Outpatient Medications:  .  acetaminophen (TYLENOL) 500 MG tablet, Take 500 mg by mouth every 6 (six) hours as needed., Disp: , Rfl:  .  Alpha-Lipoic Acid 300 MG CAPS, TAKE (1) CAPSULE BY MOUTH ONCE DAILY., Disp: 30 capsule, Rfl: 6 .  amLODipine (NORVASC) 10 MG tablet, TAKE 1 TABLET BY MOUTH ONCE DAILY., Disp: 30 tablet, Rfl: 6 .  D3 SUPER STRENGTH 2000 units CAPS, TAKE (1) CAPSULE BY MOUTH ONCE DAILY., Disp: 30 capsule, Rfl: 6 .  dextromethorphan-guaiFENesin (MUCINEX DM) 30-600 MG 12hr tablet, Take 1 tablet by mouth 2 (two) times daily., Disp: 20 tablet, Rfl: 0 .  doxycycline (VIBRA-TABS) 100 MG tablet, Take 1 tablet (100 mg total) by mouth 2 (two) times daily., Disp: 20 tablet, Rfl: 0 .   furosemide (LASIX) 40 MG tablet, TAKE 1 TABLET BY MOUTH TWICE DAILY (MORNING AND LUNCH), Disp: 180 tablet, Rfl: 1 .  latanoprost (XALATAN) 0.005 % ophthalmic solution, Place 1 drop into the right eye at bedtime. , Disp: , Rfl:  .  levothyroxine (SYNTHROID, LEVOTHROID) 50 MCG tablet, TAKE 1 TABLET BY MOUTH ONCE DAILY BEFORE BREAKFAST., Disp: 90 tablet, Rfl: 3 .  loperamide (IMODIUM A-D) 2 MG tablet, Take 1 tablet (2 mg total) by mouth 3 (three) times daily as needed for diarrhea or loose stools., Disp: 12 tablet, Rfl: 0 .  traZODone (DESYREL) 150 MG tablet, TAKE ONE TABLET BY MOUTH AT BEDTIME., Disp: 90 tablet, Rfl: 1  Review of Systems  Constitutional: Negative.   HENT: Positive for congestion.   Respiratory: Positive for cough and shortness of breath (exertion).   Cardiovascular: Negative.    Social History   Tobacco Use  . Smoking status: Never Smoker  . Smokeless tobacco: Never Used  Substance Use Topics  . Alcohol use: No    Alcohol/week: 0.0 oz   Objective:   BP 116/60 (BP Location: Right Arm, Patient Position: Sitting, Cuff Size: Normal)   Pulse 72   Wt 169 lb (76.7 kg) Comment: reported by [name] [facility] employee)  SpO2 93%   BMI 28.12 kg/m   Physical Exam  Constitutional:  She is oriented to person, place, and time. She appears well-developed and well-nourished. No distress.  HENT:  Head: Normocephalic and atraumatic.  Right Ear: Hearing normal.  Left Ear: Hearing normal.  Nose: Nose normal.  Eyes: Conjunctivae and lids are normal. Right eye exhibits no discharge. Left eye exhibits no discharge. No scleral icterus.  Cardiovascular: Normal rate.  Pulmonary/Chest: Effort normal. No respiratory distress. She has no wheezes. She has no rales.  Musculoskeletal:  Peripheral edema improved and no discomfort.  Neurological: She is alert and oriented to person, place, and time.  Skin: Skin is intact. No lesion and no rash noted.  Psychiatric: She has a normal mood and  affect. Her speech is normal and behavior is normal. Thought content normal.      Assessment & Plan:     1. Bronchitis Improved cough and congestion. Will finish the Doxycycline and Mucinex-DM in 3-4 days. Occasional sputum in the mornings. No wheeze or dyspnea at rest. Recheck as needed. CXR on 12-29-17 was negative for pulmonary disease and no sign of significant changes on CBC..  2. Hypoxia Bronchitis clearing and pulse oximetry up to 93% today (was 88% a week ago). Recheck in 3-4 months to follow up.       Dortha Kernennis Chrismon, PA  [facility]

## 2018-01-22 ENCOUNTER — Other Ambulatory Visit: Payer: Self-pay | Admitting: Family Medicine

## 2018-02-13 ENCOUNTER — Ambulatory Visit: Payer: Medicare Other | Admitting: Podiatry

## 2018-02-16 ENCOUNTER — Ambulatory Visit: Payer: Medicare Other | Admitting: Podiatry

## 2018-02-20 ENCOUNTER — Encounter: Payer: Self-pay | Admitting: Podiatry

## 2018-02-20 ENCOUNTER — Ambulatory Visit (INDEPENDENT_AMBULATORY_CARE_PROVIDER_SITE_OTHER): Payer: Medicare Other | Admitting: Podiatry

## 2018-02-20 DIAGNOSIS — M79676 Pain in unspecified toe(s): Secondary | ICD-10-CM

## 2018-02-20 DIAGNOSIS — L989 Disorder of the skin and subcutaneous tissue, unspecified: Secondary | ICD-10-CM

## 2018-02-20 DIAGNOSIS — B351 Tinea unguium: Secondary | ICD-10-CM | POA: Diagnosis not present

## 2018-02-25 NOTE — Progress Notes (Signed)
    Subjective: Patient is a 84 y.o. female presenting to the office today with a chief complaint of a painful callus lesion to the right fifth toe that has been present for the past several months. Wearing certain shoes and applying pressure to the area increases the pain. She has tried using OTC corn pads with no significant relief of the symptoms.   Patient also complains of elongated, thickened great toenails bilateral that cause pain while ambulating in shoes. She is unable to trim her own nails. Patient presents today for further treatment and evaluation.  Past Medical History:  Diagnosis Date  . Anxiety   . Blind left eye   . CKD (chronic kidney disease)   . Depression   . Hypothyroidism   . OA (osteoarthritis)     Objective:  Physical Exam General: Alert and oriented x3 in no acute distress  Dermatology: Hyperkeratotic lesion present on the right fifth toe. Pain on palpation with a central nucleated core noted. Skin is warm, dry and supple bilateral lower extremities. Negative for open lesions or macerations. Bilateral great toenails are tender, long, thickened and dystrophic with subungual debris, consistent with onychomycosis. No signs of infection noted.  Vascular: Palpable pedal pulses bilaterally. No edema or erythema noted. Capillary refill within normal limits.  Neurological: Epicritic and protective threshold grossly intact bilaterally.   Musculoskeletal Exam: Pain on palpation at the keratotic lesion noted. Range of motion within normal limits bilateral. Muscle strength 5/5 in all groups bilateral. There is a limb length discrepancy noted with the left lower extremity approximately 4 cm shorter than the right lower extremity.   Assessment: 1. limb length discrepancy-left lower extremity 4 cm shorter than right lower extremity 2. Dystrophic/painful nails bilateral great toes 3. Porokeratosis right 5th toe   Plan of Care:  1. Patient evaluated. 2. Excisional  debridement of keratoic lesion using a chisel blade was performed without incident.  3. Dressed with light dressing. 4. Mechanical debridement of nails 1-5 bilaterally performed using a nail nipper. Filed with dremel without incident.  5. Continue wearing custom shoes to accommodate LLD. Patient is to return to the clinic in 3 months.   Felecia ShellingBrent M. Evans, DPM [facility]  Dr. Felecia ShellingBrent M. Evans, DPM    [address]                                        [place], [place] [address]                Office [phone]  Fax [phone]

## 2018-03-12 ENCOUNTER — Telehealth: Payer: Self-pay | Admitting: Family Medicine

## 2018-03-12 NOTE — Telephone Encounter (Signed)
I called the patient to schedule her AWV-S w/ [facility].  The patient's caregiver said that she will have Tonya to call back and schedule the appointment. VDM (DD)

## 2018-03-13 DIAGNOSIS — H353132 Nonexudative age-related macular degeneration, bilateral, intermediate dry stage: Secondary | ICD-10-CM | POA: Diagnosis not present

## 2018-04-16 ENCOUNTER — Telehealth: Payer: Self-pay | Admitting: Family Medicine

## 2018-04-16 NOTE — Telephone Encounter (Signed)
Brandy Schroeder with Brandy Schroeder is requesting written orders to be faxed to [phone] for pt to receive flu [facility]. Please advise. Thanks TNP

## 2018-04-16 NOTE — Telephone Encounter (Signed)
Written to be faxed

## 2018-04-16 NOTE — Telephone Encounter (Signed)
Prescription has been faxed. KW

## 2018-04-25 ENCOUNTER — Ambulatory Visit (INDEPENDENT_AMBULATORY_CARE_PROVIDER_SITE_OTHER): Payer: Medicare Other

## 2018-04-25 DIAGNOSIS — Z23 Encounter for immunization: Secondary | ICD-10-CM

## 2018-05-28 ENCOUNTER — Telehealth (INDEPENDENT_AMBULATORY_CARE_PROVIDER_SITE_OTHER): Payer: Medicare Other | Admitting: Family Medicine

## 2018-05-28 DIAGNOSIS — N3090 Cystitis, unspecified without hematuria: Secondary | ICD-10-CM

## 2018-05-28 NOTE — Telephone Encounter (Signed)
Brandy Schroeder [facility] [phone]  Pt's urine is very strong smelling. Asking if a sample can be dropped off tomorrow for testing?  Please advise.  Thanks, [employer]

## 2018-05-28 NOTE — Telephone Encounter (Signed)
OK, if you have a sterile container and keep it from getting too hot before getting it to the office for testing.

## 2018-05-29 ENCOUNTER — Ambulatory Visit (INDEPENDENT_AMBULATORY_CARE_PROVIDER_SITE_OTHER): Payer: Medicare Other | Admitting: Podiatry

## 2018-05-29 ENCOUNTER — Encounter: Payer: Self-pay | Admitting: Podiatry

## 2018-05-29 DIAGNOSIS — M79676 Pain in unspecified toe(s): Secondary | ICD-10-CM

## 2018-05-29 DIAGNOSIS — B351 Tinea unguium: Secondary | ICD-10-CM | POA: Diagnosis not present

## 2018-05-29 NOTE — Telephone Encounter (Signed)
Brandy Schroeder from [facility] was advised and will drop the urine off tomorrow 05/30/2018 in a sterile container.

## 2018-05-30 DIAGNOSIS — N3090 Cystitis, unspecified without hematuria: Secondary | ICD-10-CM | POA: Diagnosis not present

## 2018-05-30 LAB — POCT URINALYSIS DIPSTICK
Bilirubin, UA: NEGATIVE
Glucose, UA: NEGATIVE
Ketones, UA: NEGATIVE
Nitrite, UA: POSITIVE
PH UA: 6.5 (ref 5.0–8.0)
Protein, UA: NEGATIVE
Spec Grav, UA: 1.01 (ref 1.010–1.025)
UROBILINOGEN UA: 0.2 U/dL

## 2018-05-30 MED ORDER — CIPROFLOXACIN HCL 500 MG PO TABS: 500.0000 mg | ORAL_TABLET | Freq: Two times a day (BID) | ORAL | 0 refills | Status: DC

## 2018-05-30 NOTE — Addendum Note (Signed)
Addended by: Benjiman Core on: 05/30/2018 04:51 PM   Modules accepted: Orders

## 2018-05-30 NOTE — Telephone Encounter (Signed)
Please review for Brandy Schroeder,   Urine was dropped off this morning.  It was Positive for Nitrites, Trace blood, and Moderate Leukocytes.    Please advise.    Thanks,   -Vernona Rieger

## 2018-05-30 NOTE — Addendum Note (Signed)
Addended by: Kavin Leech E on: 05/30/2018 11:36 AM   Modules accepted: Orders

## 2018-05-30 NOTE — Telephone Encounter (Signed)
Please start ciprofloxacin 500mg  twice a day for 7 days.

## 2018-05-30 NOTE — Telephone Encounter (Signed)
I called [facility] and was advised to fax prescription to their facility, Prescription faxed.

## 2018-05-31 NOTE — Telephone Encounter (Signed)
Acknowledged.  Thank you.

## 2018-05-31 NOTE — Progress Notes (Signed)
   SUBJECTIVE Patient presents to office today complaining of elongated, thickened nails that cause pain while ambulating in shoes. She is unable to trim her own nails. Patient is here for further evaluation and treatment.  Past Medical History:  Diagnosis Date  . Anxiety   . Blind left eye   . CKD (chronic kidney disease)   . Depression   . Hypothyroidism   . OA (osteoarthritis)     OBJECTIVE General Patient is awake, alert, and oriented x 3 and in no acute distress. Derm Skin is dry and supple bilateral. Negative open lesions or macerations. Remaining integument unremarkable. Nails are tender, long, thickened and dystrophic with subungual debris, consistent with onychomycosis, 1-5 bilateral. No signs of infection noted. Vasc  DP and PT pedal pulses palpable bilaterally. Temperature gradient within normal limits.  Neuro Epicritic and protective threshold sensation grossly intact bilaterally.  Musculoskeletal Exam No symptomatic pedal deformities noted bilateral. Muscular strength within normal limits.  ASSESSMENT 1. Onychodystrophic nails 1-5 bilateral with hyperkeratosis of nails.  2. Onychomycosis of nail due to dermatophyte bilateral 3. Pain in foot bilateral  PLAN OF CARE 1. Patient evaluated today.  2. Instructed to maintain good pedal hygiene and foot care.  3. Mechanical debridement of nails 1-5 bilaterally performed using a nail nipper. Filed with dremel without incident.  4. Return to clinic in 3 mos.    Felecia Shelling, DPM [facility]  Dr. Felecia Shelling, DPM    [address]                                        [place], [place] [address]                Office [phone]  Fax [phone]

## 2018-06-02 LAB — CULTURE, URINE COMPREHENSIVE

## 2018-06-04 NOTE — Telephone Encounter (Signed)
Check with [facility] to be sure they received this Cipro prescription.

## 2018-06-05 ENCOUNTER — Telehealth: Payer: Self-pay | Admitting: Family Medicine

## 2018-06-05 ENCOUNTER — Other Ambulatory Visit: Payer: Self-pay | Admitting: Family Medicine

## 2018-06-05 DIAGNOSIS — K5909 Other constipation: Secondary | ICD-10-CM

## 2018-06-05 MED ORDER — POLYETHYLENE GLYCOL 3350 17 GM/SCOOP PO POWD: 17.0000 g | Freq: Two times a day (BID) | ORAL | 1 refills | Status: AC | PRN

## 2018-06-05 NOTE — Telephone Encounter (Signed)
Please advise 

## 2018-06-05 NOTE — Telephone Encounter (Signed)
Brandy JohnsBarbra Ribelin - [phone] / [name]  Pt is constipated.  Hasn't had a BM since Sunday.  They have tried warm apple juice and prune juice with nothing working.  Asking if [name] could call in something for pt to have a BM.  Thanks, [employer]

## 2018-06-05 NOTE — Telephone Encounter (Signed)
Sent Miralax prescription to the [facility] in [place]. Mix the 17 grams of powder in 8 ounces of clear Gatorade and can use it twice a day until BM occurs. May cause some diarrhea if used more than 2 doses. Should be checked here or in the ER if abdominal pain occurs.

## 2018-06-05 NOTE — Telephone Encounter (Signed)
[name].  She says the faxed order for the Rx was cut off / missing.  Please refax to [phone].  Thanks, [employer]

## 2018-06-05 NOTE — Telephone Encounter (Signed)
Spoke to [facility] and advised rx was sent to pharmacy and directions.  She is requesting a order or rx be faxed to her to put in pt's chart.  I faxed the rx order to the [phone].  dbs

## 2018-06-08 ENCOUNTER — Encounter: Payer: Self-pay | Admitting: Family Medicine

## 2018-06-08 ENCOUNTER — Other Ambulatory Visit: Payer: Self-pay

## 2018-06-08 ENCOUNTER — Ambulatory Visit (INDEPENDENT_AMBULATORY_CARE_PROVIDER_SITE_OTHER): Payer: Medicare Other | Admitting: Family Medicine

## 2018-06-08 VITALS — BP 110/58 | HR 59 | Wt 181.0 lb

## 2018-06-08 DIAGNOSIS — R609 Edema, unspecified: Secondary | ICD-10-CM

## 2018-06-08 DIAGNOSIS — N184 Chronic kidney disease, stage 4 (severe): Secondary | ICD-10-CM | POA: Diagnosis not present

## 2018-06-08 MED ORDER — METOLAZONE 2.5 MG PO TABS: 2.5000 mg | ORAL_TABLET | Freq: Every day | ORAL | 0 refills | Status: DC

## 2018-06-08 NOTE — Progress Notes (Signed)
Patient: Brandy JoyChristine M Schroeder Female    DOB: 01/16/1919   84 y.o.   MRN: [id number] Visit Date: 06/08/2018  Today's Provider: Dortha Kernennis Chrismon, PA   Chief Complaint  Patient presents with  . abnormal weight gain    and pt has other issues going on also.   Subjective:    HPI  Pt's caregiver reports that pt has had a 2 lb weight gain on 06/07/18 and a 4 lb weight gain this morning 06/08/18.  Her normal weight is usually around 172 - 174 this month (nov).  Caregiver states that they noticed pt's leg swelling on 06/07/18 and they swelled even bigger in the late evening.  This morning they had nurse come by and they heard some crackling in her lower right lung.  Pt hasn't had any cough symptoms but states its hard to swallow because she has a feeling of a lump In her throat.  Pt recently was put on antibiotics for a UTI on 05/31/18 and finished them on 06/06/18.  Caregiver reports that pt has had episodes of hallucinations some and being shaky that started 06/07/18 and has been shaky today.  Pt is able to pull herself up and did walk down hall this morning to bathroom but had to be put in wheelchair coming back.  Care giver reports that pt has had some depression lately because pt's grandson passed away 2 months ago.  Wt Readings from Last 3 Encounters:  06/08/18 181 lb (82.1 kg)  01/05/18 169 lb (76.7 kg)  12/29/17 168 lb (76.2 kg)      Past Medical History:  Diagnosis Date  . Anxiety   . Blind left eye   . CKD (chronic kidney disease)   . Depression   . Hypothyroidism   . OA (osteoarthritis)    Past Surgical History:  Procedure Laterality Date  . ABDOMINAL HYSTERECTOMY    . CHOLECYSTECTOMY    . EYE SURGERY     Family History  Problem Relation Age of Onset  . Arthritis Mother   . Stroke Mother   . Heart disease Brother   . Arthritis Brother    Allergies  Allergen Reactions  . Sulfa Antibiotics     Current Outpatient Medications:  .  acetaminophen (TYLENOL) 500 MG  tablet, Take 500 mg by mouth every 6 (six) hours as needed., Disp: , Rfl:  .  Alpha-Lipoic Acid 300 MG CAPS, TAKE (1) CAPSULE BY MOUTH ONCE DAILY., Disp: 90 capsule, Rfl: 2 .  amLODipine (NORVASC) 10 MG tablet, TAKE 1 TABLET BY MOUTH ONCE DAILY., Disp: 90 tablet, Rfl: 2 .  D3 SUPER STRENGTH 2000 units CAPS, TAKE (1) CAPSULE BY MOUTH ONCE DAILY., Disp: 90 capsule, Rfl: 2 .  dextromethorphan-guaiFENesin (MUCINEX DM) 30-600 MG 12hr tablet, Take 1 tablet by mouth 2 (two) times daily., Disp: 20 tablet, Rfl: 0 .  doxycycline (VIBRA-TABS) 100 MG tablet, Take 1 tablet (100 mg total) by mouth 2 (two) times daily., Disp: 20 tablet, Rfl: 0 .  furosemide (LASIX) 40 MG tablet, TAKE 1 TABLET BY MOUTH TWICE DAILY (MORNING AND LUNCH), Disp: 180 tablet, Rfl: 1 .  latanoprost (XALATAN) 0.005 % ophthalmic solution, Place 1 drop into the right eye at bedtime. , Disp: , Rfl:  .  levothyroxine (SYNTHROID, LEVOTHROID) 50 MCG tablet, TAKE 1 TABLET BY MOUTH ONCE DAILY BEFORE BREAKFAST., Disp: 90 tablet, Rfl: 3 .  loperamide (IMODIUM A-D) 2 MG tablet, Take 1 tablet (2 mg total) by mouth 3 (three) times  daily as needed for diarrhea or loose stools., Disp: 12 tablet, Rfl: 0 .  polyethylene glycol powder (GLYCOLAX/MIRALAX) powder, Take 17 g by mouth 2 (two) times daily as needed. Mix powder in 8 ounces of Gatorade., Disp: 116 g, Rfl: 1 .  traZODone (DESYREL) 150 MG tablet, TAKE ONE TABLET BY MOUTH AT BEDTIME., Disp: 90 tablet, Rfl: 1 .  ciprofloxacin (CIPRO) 500 MG tablet, Take 1 tablet (500 mg total) by mouth 2 (two) times daily. (Patient not taking: Reported on 06/08/2018), Disp: 14 tablet, Rfl: 0  Review of Systems  Constitutional: Positive for unexpected weight change.  HENT: Positive for trouble swallowing (feels like a lump in throat). Negative for congestion, dental problem, drooling, ear discharge, ear pain, facial swelling, hearing loss, mouth sores, nosebleeds, postnasal drip, rhinorrhea, sinus pressure, sinus pain,  sneezing, sore throat, tinnitus and voice change.   Eyes: Positive for redness. Negative for photophobia, pain, discharge, itching and visual disturbance.  Respiratory: Positive for cough (in the morning when pt first wakes up) and wheezing (nurse heard crakles in right lung on 06/08/18). Negative for apnea, choking, chest tightness, shortness of breath and stridor.   Cardiovascular: Negative.   Gastrointestinal: Positive for constipation (given laxitive and it worked). Negative for abdominal distention, abdominal pain, anal bleeding, blood in stool, diarrhea, nausea, rectal pain and vomiting.  Endocrine: Negative.   Genitourinary: Negative.   Musculoskeletal: Negative.   Skin: Negative.   Allergic/Immunologic: Negative.   Neurological: Positive for tremors and weakness. Negative for dizziness, seizures, syncope, facial asymmetry, speech difficulty, light-headedness, numbness and headaches.  Hematological: Negative.   Psychiatric/Behavioral: Positive for hallucinations (not all the time but just happened while on antibiotic). Negative for agitation, behavioral problems, confusion, decreased concentration, dysphoric mood, self-injury, sleep disturbance and suicidal ideas. The patient is not nervous/anxious and is not hyperactive.    Social History   Tobacco Use  . Smoking status: Never Smoker  . Smokeless tobacco: Never Used  Substance Use Topics  . Alcohol use: No    Alcohol/week: 0.0 standard drinks   Objective:   BP (!) 110/58 (BP Location: Right Arm, Patient Position: Sitting, Cuff Size: Normal)   Pulse (!) 59   Wt 181 lb (82.1 kg) Comment: per pt caregiver pt weight at facility this morning 06/08/18  BMI 30.12 kg/m  Vitals:   06/08/18 1126  BP: (!) 110/58  Pulse: (!) 59  Weight: 181 lb (82.1 kg)   Physical Exam  Constitutional: She is oriented to person, place, and time. She appears well-developed and well-nourished. No distress.  HENT:  Head: Normocephalic and atraumatic.    Right Ear: Hearing normal.  Left Ear: Hearing normal.  Nose: Nose normal.  Eyes: Conjunctivae and lids are normal. Right eye exhibits no discharge. Left eye exhibits no discharge. No scleral icterus.  Neck: Neck supple. No JVD present.  Cardiovascular: Normal rate and regular rhythm.  Pulmonary/Chest: Effort normal. No respiratory distress. She has rales.  Few fine rales in bases.  Abdominal: Soft. Bowel sounds are normal.  Musculoskeletal: She exhibits edema.  3+ pitting edema in feet up to knees.  Neurological: She is alert and oriented to person, place, and time.  Skin: Skin is intact. No lesion and no rash noted.  Psychiatric: She has a normal mood and affect. Her speech is normal and behavior is normal. Thought content normal.      Assessment & Plan:     1. Edema, peripheral Increase in weight from her usual 174 to 181 over the past 24-48  hours. Still taking the Lasix 40 mg BID. Will add Zaroxoly 2.5 mg qd for 3 days and have caregiver check BP with weight daily then call report of progress in 3 days. Recheck CBC and BMP to assess renal function and rule out infection. Suspect CHF flare. - CBC with Differential/Platelet - Basic Metabolic Panel (BMET) - metolazone (ZAROXOLYN) 2.5 MG tablet; Take 1 tablet (2.5 mg total) by mouth daily. Take as directed for 3 days then call report of progress.  Dispense: 30 tablet; Refill: 0  2. CKD (chronic kidney disease), stage IV (HCC) Recent UTI treated with Cipro and patient had a little confusion reaction. Finished this antibiotic and not further UTI symptoms. Recheck renal function and CBC. - CBC with Differential/Platelet - Basic Metabolic Panel (BMET)       Dortha Kern, PA  [facility]

## 2018-06-10 ENCOUNTER — Emergency Department: Payer: Medicare Other

## 2018-06-10 ENCOUNTER — Inpatient Hospital Stay
Admission: EM | Admit: 2018-06-10 | Discharge: 2018-06-15 | DRG: 871 | Disposition: A | Discharge status: Hospice | Payer: Medicare Other | Attending: Internal Medicine | Admitting: Internal Medicine

## 2018-06-10 ENCOUNTER — Encounter: Payer: Self-pay | Admitting: Emergency Medicine

## 2018-06-10 ENCOUNTER — Other Ambulatory Visit: Payer: Self-pay

## 2018-06-10 DIAGNOSIS — T502X5A Adverse effect of carbonic-anhydrase inhibitors, benzothiadiazides and other diuretics, initial encounter: Secondary | ICD-10-CM | POA: Diagnosis present

## 2018-06-10 DIAGNOSIS — A419 Sepsis, unspecified organism: Secondary | ICD-10-CM | POA: Diagnosis present

## 2018-06-10 DIAGNOSIS — D631 Anemia in chronic kidney disease: Secondary | ICD-10-CM | POA: Diagnosis present

## 2018-06-10 DIAGNOSIS — N170 Acute kidney failure with tubular necrosis: Secondary | ICD-10-CM | POA: Diagnosis present

## 2018-06-10 DIAGNOSIS — R0902 Hypoxemia: Secondary | ICD-10-CM | POA: Diagnosis not present

## 2018-06-10 DIAGNOSIS — I509 Heart failure, unspecified: Secondary | ICD-10-CM | POA: Diagnosis not present

## 2018-06-10 DIAGNOSIS — I130 Hypertensive heart and chronic kidney disease with heart failure and stage 1 through stage 4 chronic kidney disease, or unspecified chronic kidney disease: Secondary | ICD-10-CM | POA: Diagnosis present

## 2018-06-10 DIAGNOSIS — M7989 Other specified soft tissue disorders: Secondary | ICD-10-CM

## 2018-06-10 DIAGNOSIS — I959 Hypotension, unspecified: Secondary | ICD-10-CM | POA: Diagnosis not present

## 2018-06-10 DIAGNOSIS — I5032 Chronic diastolic (congestive) heart failure: Secondary | ICD-10-CM | POA: Diagnosis present

## 2018-06-10 DIAGNOSIS — E039 Hypothyroidism, unspecified: Secondary | ICD-10-CM | POA: Diagnosis present

## 2018-06-10 DIAGNOSIS — H548 Legal blindness, as defined in USA: Secondary | ICD-10-CM | POA: Diagnosis present

## 2018-06-10 DIAGNOSIS — R001 Bradycardia, unspecified: Secondary | ICD-10-CM | POA: Diagnosis not present

## 2018-06-10 DIAGNOSIS — I482 Chronic atrial fibrillation, unspecified: Secondary | ICD-10-CM | POA: Diagnosis present

## 2018-06-10 DIAGNOSIS — N184 Chronic kidney disease, stage 4 (severe): Secondary | ICD-10-CM | POA: Diagnosis present

## 2018-06-10 DIAGNOSIS — R4182 Altered mental status, unspecified: Secondary | ICD-10-CM | POA: Diagnosis not present

## 2018-06-10 DIAGNOSIS — Z90710 Acquired absence of both cervix and uterus: Secondary | ICD-10-CM

## 2018-06-10 DIAGNOSIS — Z79890 Hormone replacement therapy (postmenopausal): Secondary | ICD-10-CM | POA: Diagnosis not present

## 2018-06-10 DIAGNOSIS — G9341 Metabolic encephalopathy: Secondary | ICD-10-CM | POA: Diagnosis present

## 2018-06-10 DIAGNOSIS — N179 Acute kidney failure, unspecified: Secondary | ICD-10-CM

## 2018-06-10 DIAGNOSIS — Z66 Do not resuscitate: Secondary | ICD-10-CM | POA: Diagnosis present

## 2018-06-10 DIAGNOSIS — Z79899 Other long term (current) drug therapy: Secondary | ICD-10-CM

## 2018-06-10 DIAGNOSIS — R410 Disorientation, unspecified: Secondary | ICD-10-CM | POA: Diagnosis not present

## 2018-06-10 DIAGNOSIS — I10 Essential (primary) hypertension: Secondary | ICD-10-CM | POA: Diagnosis not present

## 2018-06-10 DIAGNOSIS — H9190 Unspecified hearing loss, unspecified ear: Secondary | ICD-10-CM | POA: Diagnosis present

## 2018-06-10 DIAGNOSIS — R609 Edema, unspecified: Secondary | ICD-10-CM | POA: Diagnosis not present

## 2018-06-10 DIAGNOSIS — Z515 Encounter for palliative care: Secondary | ICD-10-CM | POA: Diagnosis present

## 2018-06-10 DIAGNOSIS — N390 Urinary tract infection, site not specified: Secondary | ICD-10-CM | POA: Diagnosis not present

## 2018-06-10 DIAGNOSIS — R5383 Other fatigue: Secondary | ICD-10-CM | POA: Diagnosis not present

## 2018-06-10 DIAGNOSIS — Z7401 Bed confinement status: Secondary | ICD-10-CM | POA: Diagnosis not present

## 2018-06-10 DIAGNOSIS — N3000 Acute cystitis without hematuria: Secondary | ICD-10-CM | POA: Diagnosis present

## 2018-06-10 DIAGNOSIS — N139 Obstructive and reflux uropathy, unspecified: Secondary | ICD-10-CM | POA: Diagnosis not present

## 2018-06-10 DIAGNOSIS — N3090 Cystitis, unspecified without hematuria: Secondary | ICD-10-CM

## 2018-06-10 DIAGNOSIS — I5030 Unspecified diastolic (congestive) heart failure: Secondary | ICD-10-CM | POA: Diagnosis not present

## 2018-06-10 HISTORY — DX: Heart failure, unspecified: I50.9

## 2018-06-10 LAB — CBC WITH DIFFERENTIAL/PLATELET
Abs Immature Granulocytes: 0.02 10*3/uL (ref 0.00–0.07)
Basophils Absolute: 0 10*3/uL (ref 0.0–0.1)
Basophils Relative: 0 %
EOS ABS: 0 10*3/uL (ref 0.0–0.5)
Eosinophils Relative: 0 %
HEMATOCRIT: 36.9 % (ref 36.0–46.0)
Hemoglobin: 11.9 g/dL — ABNORMAL LOW (ref 12.0–15.0)
IMMATURE GRANULOCYTES: 0 %
LYMPHS ABS: 0.5 10*3/uL — AB (ref 0.7–4.0)
LYMPHS PCT: 9 %
MCH: 30.3 pg (ref 26.0–34.0)
MCHC: 32.2 g/dL (ref 30.0–36.0)
MCV: 93.9 fL (ref 80.0–100.0)
MONOS PCT: 5 %
Monocytes Absolute: 0.3 10*3/uL (ref 0.1–1.0)
NEUTROS ABS: 4.7 10*3/uL (ref 1.7–7.7)
Neutrophils Relative %: 86 %
Platelets: 129 10*3/uL — ABNORMAL LOW (ref 150–400)
RBC: 3.93 MIL/uL (ref 3.87–5.11)
RDW: 15.8 % — AB (ref 11.5–15.5)
WBC: 5.6 10*3/uL (ref 4.0–10.5)
nRBC: 0.4 % — ABNORMAL HIGH (ref 0.0–0.2)

## 2018-06-10 LAB — COMPREHENSIVE METABOLIC PANEL
ALBUMIN: 3.9 g/dL (ref 3.5–5.0)
ALT: 94 U/L — ABNORMAL HIGH (ref 0–44)
AST: 77 U/L — AB (ref 15–41)
Alkaline Phosphatase: 71 U/L (ref 38–126)
Anion gap: 10 (ref 5–15)
BILIRUBIN TOTAL: 0.7 mg/dL (ref 0.3–1.2)
BUN: 68 mg/dL — AB (ref 8–23)
CHLORIDE: 103 mmol/L (ref 98–111)
CO2: 28 mmol/L (ref 22–32)
Calcium: 9.4 mg/dL (ref 8.9–10.3)
Creatinine, Ser: 2.17 mg/dL — ABNORMAL HIGH (ref 0.44–1.00)
GFR calc Af Amer: 21 mL/min — ABNORMAL LOW (ref >60)
GFR calc non Af Amer: 18 mL/min — ABNORMAL LOW (ref >60)
GLUCOSE: 128 mg/dL — AB (ref 70–99)
Potassium: 4.6 mmol/L (ref 3.5–5.1)
Sodium: 141 mmol/L (ref 135–145)
Total Protein: 7.2 g/dL (ref 6.5–8.1)

## 2018-06-10 LAB — URINALYSIS, COMPLETE (UACMP) WITH MICROSCOPIC
Bilirubin Urine: NEGATIVE
Glucose, UA: NEGATIVE mg/dL
Hgb urine dipstick: NEGATIVE
KETONES UR: NEGATIVE mg/dL
NITRITE: NEGATIVE
PROTEIN: NEGATIVE mg/dL
Specific Gravity, Urine: 1.009 (ref 1.005–1.030)
WBC, UA: >50 WBC/hpf — ABNORMAL HIGH (ref 0–5)
pH: 5 (ref 5.0–8.0)

## 2018-06-10 LAB — BRAIN NATRIURETIC PEPTIDE: B NATRIURETIC PEPTIDE 5: 236 pg/mL — AB (ref 0.0–100.0)

## 2018-06-10 LAB — TSH: TSH: 13.11 u[IU]/mL — AB (ref 0.350–4.500)

## 2018-06-10 LAB — CG4 I-STAT (LACTIC ACID): LACTIC ACID, VENOUS: 1.69 mmol/L (ref 0.5–1.9)

## 2018-06-10 LAB — PROTIME-INR
INR: 0.98
PROTHROMBIN TIME: 12.9 s (ref 11.4–15.2)

## 2018-06-10 LAB — TROPONIN I: Troponin I: <0.03 ng/mL (ref <0.03)

## 2018-06-10 LAB — PROCALCITONIN: PROCALCITONIN: 0.1 ng/mL

## 2018-06-10 MED ORDER — ONDANSETRON HCL 4 MG/2ML IJ SOLN
4.0000 mg | Freq: Four times a day (QID) | INTRAMUSCULAR | Status: DC | PRN
  Filled 2018-06-10: qty 2

## 2018-06-10 MED ORDER — ACETAMINOPHEN 325 MG PO TABS
650.0000 mg | ORAL_TABLET | Freq: Four times a day (QID) | ORAL | Status: DC | PRN
  Administered 2018-06-13 – 2018-06-15 (×3): 650 mg via ORAL
  Filled 2018-06-10 (×3): qty 2

## 2018-06-10 MED ORDER — POLYETHYLENE GLYCOL 3350 17 G PO PACK: 17.0000 g | PACK | Freq: Two times a day (BID) | ORAL | Status: DC | PRN

## 2018-06-10 MED ORDER — ONDANSETRON HCL 4 MG PO TABS: 4.0000 mg | ORAL_TABLET | Freq: Four times a day (QID) | ORAL | Status: DC | PRN

## 2018-06-10 MED ORDER — SODIUM CHLORIDE 0.9 % IV SOLN
2.0000 g | Freq: Once | INTRAVENOUS | Status: AC
  Administered 2018-06-10: 2 g via INTRAVENOUS
  Filled 2018-06-10: qty 2

## 2018-06-10 MED ORDER — TRAZODONE HCL 50 MG PO TABS
150.0000 mg | ORAL_TABLET | Freq: Every day | ORAL | Status: DC
  Administered 2018-06-10: 150 mg via ORAL
  Filled 2018-06-10: qty 1

## 2018-06-10 MED ORDER — SODIUM CHLORIDE 0.9 % IV SOLN
INTRAVENOUS | Status: DC
  Administered 2018-06-10: 23:00:00 via INTRAVENOUS

## 2018-06-10 MED ORDER — ACETAMINOPHEN 650 MG RE SUPP: 650.0000 mg | Freq: Four times a day (QID) | RECTAL | Status: DC | PRN

## 2018-06-10 MED ORDER — POLYETHYLENE GLYCOL 3350 17 GM/SCOOP PO POWD
17.0000 g | Freq: Two times a day (BID) | ORAL | Status: DC | PRN
  Filled 2018-06-10: qty 255

## 2018-06-10 MED ORDER — SODIUM CHLORIDE 0.9 % IV SOLN
1.0000 g | INTRAVENOUS | Status: DC
  Administered 2018-06-11 – 2018-06-13 (×3): 1 g via INTRAVENOUS
  Filled 2018-06-10 (×3): qty 1

## 2018-06-10 MED ORDER — AMLODIPINE BESYLATE 10 MG PO TABS
10.0000 mg | ORAL_TABLET | Freq: Every day | ORAL | Status: DC
  Filled 2018-06-10: qty 1

## 2018-06-10 MED ORDER — LEVOTHYROXINE SODIUM 50 MCG PO TABS
50.0000 ug | ORAL_TABLET | Freq: Every day | ORAL | Status: DC
  Administered 2018-06-11 – 2018-06-15 (×5): 50 ug via ORAL
  Filled 2018-06-10 (×5): qty 1

## 2018-06-10 MED ORDER — LATANOPROST 0.005 % OP SOLN
1.0000 [drp] | Freq: Every day | OPHTHALMIC | Status: DC
  Administered 2018-06-11 – 2018-06-14 (×4): 1 [drp] via OPHTHALMIC
  Filled 2018-06-10: qty 2.5

## 2018-06-10 MED ORDER — HEPARIN SODIUM (PORCINE) 5000 UNIT/ML IJ SOLN
5000.0000 [IU] | Freq: Three times a day (TID) | INTRAMUSCULAR | Status: DC
  Administered 2018-06-10 – 2018-06-14 (×13): 5000 [IU] via SUBCUTANEOUS
  Filled 2018-06-10 (×12): qty 1

## 2018-06-10 NOTE — H&P (Signed)
[facility] - [place] at [facility]   PATIENT NAME: Brandy Schroeder    MR#:  [id number]  DATE OF BIRTH:  [birth year]  DATE OF ADMISSION:  06/10/2018  PRIMARY CARE PHYSICIAN: Tamsen Roers, PA   REQUESTING/REFERRING PHYSICIAN: Dr. Sharman Cheek  CHIEF COMPLAINT:   Chief Complaint  Patient presents with  . Leg Swelling    HISTORY OF PRESENT ILLNESS:  Brandy Schroeder  is a 84 y.o. 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 Patient is from assisted living facility, ambulatory with a walker at baseline and is oriented.  She has recently suffered from UTI and was on Cipro up until recently.  The caregiver also noticed that patient was noted to be dyspneic with bibasilar crackles and also worsening pedal edema.  She has put on almost 6 pounds within the last week.  She was taken to see her PCP 3 days ago and was started on metolazone for 3 days daily along with her twice daily 40 mg of Lasix.  Her pedal edema has improved though her weight only dropped down by 3 pounds.  She was noted to be very confused this morning and also difficulty getting out of bed and was noted to have chills and hypothermia so brought to the emergency room.  Urine still looks like she has a UTI.  Labs indicate acute renal failure on chronic renal insufficiency.  PAST MEDICAL HISTORY:   Past Medical History:  Diagnosis Date  . Anxiety   . Blind left eye   . CHF (congestive heart failure) (HCC)   . CKD (chronic kidney disease)   . Depression   . Hypothyroidism   . OA (osteoarthritis)     PAST SURGICAL HISTORY:   Past Surgical History:  Procedure Laterality Date  . ABDOMINAL HYSTERECTOMY    . CHOLECYSTECTOMY    . EYE SURGERY       SOCIAL HISTORY:   Social History   Tobacco Use  . Smoking status: Never Smoker  . Smokeless tobacco: Never Used  Substance Use Topics  . Alcohol use: No    Alcohol/week: 0.0 standard drinks    FAMILY HISTORY:   Family History  Problem Relation Age of Onset  . Arthritis Mother   . Stroke Mother   . Heart disease Brother   . Arthritis Brother     DRUG ALLERGIES:   Allergies  Allergen Reactions  . Sulfa Antibiotics     REVIEW OF SYSTEMS:   Review of Systems  Constitutional: Positive for chills and fever. Negative for malaise/fatigue and weight loss.  HENT: Positive for hearing loss. Negative for ear discharge, ear pain, nosebleeds and tinnitus.   Eyes: Negative for blurred vision, double vision and photophobia.  Respiratory: Positive for shortness of breath. Negative for cough, hemoptysis and wheezing.   Cardiovascular: Positive for orthopnea and leg swelling. Negative for chest pain and palpitations.  Gastrointestinal: Negative for abdominal pain, constipation, diarrhea, heartburn, melena, nausea and vomiting.  Genitourinary: Negative for dysuria, frequency, hematuria and urgency.  Musculoskeletal: Negative for back pain, myalgias and neck pain.  Skin: Negative for rash.  Neurological: Negative for dizziness, tingling, tremors, sensory change, speech change, focal weakness and headaches.  Endo/Heme/Allergies: Does not bruise/bleed easily.  Psychiatric/Behavioral: Negative for depression.    MEDICATIONS AT HOME:  Prior to Admission medications   Medication Sig Start Date End Date Taking? Authorizing Provider  acetaminophen (TYLENOL) 500 MG tablet Take 500 mg by mouth every 6 (six) hours as needed.    [provider]  Alpha-Lipoic Acid 300 MG CAPS TAKE (1) CAPSULE BY MOUTH ONCE DAILY. 01/22/18   Chrismon, Jodell Cipro, PA  amLODipine (NORVASC) 10 MG tablet TAKE 1 TABLET BY MOUTH ONCE DAILY. 01/22/18   Chrismon, Jodell Cipro, PA  ciprofloxacin (CIPRO) 500 MG tablet  Take 1 tablet (500 mg total) by mouth 2 (two) times daily. Patient not taking: Reported on 06/08/2018 05/30/18   Malva Limes, MD  D3 SUPER STRENGTH 2000 units CAPS TAKE (1) CAPSULE BY MOUTH ONCE DAILY. 01/22/18   Chrismon, Jodell Cipro, PA  dextromethorphan-guaiFENesin (MUCINEX DM) 30-600 MG 12hr tablet Take 1 tablet by mouth 2 (two) times daily. 12/29/17   Chrismon, Jodell Cipro, PA  doxycycline (VIBRA-TABS) 100 MG tablet Take 1 tablet (100 mg total) by mouth 2 (two) times daily. 12/29/17   Chrismon, Jodell Cipro, PA  furosemide (LASIX) 40 MG tablet TAKE 1 TABLET BY MOUTH TWICE DAILY (MORNING AND LUNCH) 12/25/17   Chrismon, Jodell Cipro, PA  latanoprost (XALATAN) 0.005 % ophthalmic solution Place 1 drop into the right eye at bedtime.     [provider]  levothyroxine (SYNTHROID, LEVOTHROID) 50 MCG tablet TAKE 1 TABLET BY MOUTH ONCE DAILY BEFORE BREAKFAST. 12/25/17   Chrismon, Jodell Cipro, PA  loperamide (IMODIUM A-D) 2 MG tablet Take 1 tablet (2 mg total) by mouth 3 (three) times daily as needed for diarrhea or loose stools. 11/24/17   Chrismon, Jodell Cipro, PA  metolazone (ZAROXOLYN) 2.5 MG tablet Take 1 tablet (2.5 mg total) by mouth daily. Take as directed for 3 days then call report of progress. 06/08/18   Chrismon, Jodell Cipro, PA  polyethylene glycol powder (GLYCOLAX/MIRALAX) powder Take 17 g by mouth 2 (two) times daily as needed. Mix powder in 8 ounces of Gatorade. 06/05/18   Chrismon, Jodell Cipro, PA  traZODone (DESYREL) 150 MG tablet TAKE ONE TABLET BY MOUTH AT BEDTIME. 12/25/17   Chrismon, Jodell Cipro, PA      VITAL SIGNS:  Blood pressure 102/61, pulse 63, temperature (!) 90.6 F (32.6 C), temperature source Rectal, resp. rate 19, height 5\' 6"  (1.676 m), weight 82.1 kg, SpO2 94 %.  PHYSICAL EXAMINATION:   Physical Exam  GENERAL:  84 y.o.-year-old elderly patient lying in the bed with no acute distress.  Patient is very hard of hearing EYES: Left pupil is dilated and not reacting to light.  Right pupil is  reacting to light.. No scleral icterus. Extraocular muscles intact.  HEENT: Head atraumatic, normocephalic. Oropharynx and nasopharynx clear.  NECK:  Supple, no jugular venous distention. No thyroid enlargement, no tenderness.  LUNGS: Normal breath sounds bilaterally, no wheezing, rales,rhonchi or crepitation. No use of accessory muscles of respiration.  Decreased bibasilar breath sounds. CARDIOVASCULAR: S1, S2 normal. No  rubs, or gallops.  2/6 systolic murmur is present ABDOMEN: Soft, nontender, nondistended. Bowel sounds present. No organomegaly or mass.  EXTREMITIES: No  cyanosis, or clubbing.  2+ bilateral pedal edema noted NEUROLOGIC: Cranial nerves II through XII are intact.  Patient is very hard of hearing.  Muscle strength 5/5 in all extremities. Sensation intact. Gait not checked.  Global weakness noted PSYCHIATRIC: The patient is alert and oriented x 3.  SKIN: No obvious rash, lesion, or ulcer.   LABORATORY PANEL:   CBC Recent Labs  Lab 06/10/18 1750  WBC 5.6  HGB 11.9*  HCT 36.9  PLT 129*   ------------------------------------------------------------------------------------------------------------------  Chemistries  Recent Labs  Lab 06/10/18 1750  NA 141  K 4.6  CL 103  CO2 28  GLUCOSE 128*  BUN 68*  CREATININE 2.17*  CALCIUM 9.4  AST 77*  ALT 94*  ALKPHOS 71  BILITOT 0.7   ------------------------------------------------------------------------------------------------------------------  Cardiac Enzymes Recent Labs  Lab 06/10/18 1750  TROPONINI <0.03   ------------------------------------------------------------------------------------------------------------------  RADIOLOGY:  Dg Chest Port 1 View  Result Date: 06/10/2018 CLINICAL DATA:  Lethargy EXAM: PORTABLE CHEST 1 VIEW COMPARISON:  12/29/2017 FINDINGS: Possible small pleural effusions. Linear scarring at the left base. Mild cardiomegaly with aortic atherosclerosis. Apical calcification and  pleural thickening. No pneumothorax. IMPRESSION: 1. Suspected small pleural effusions with linear scarring or atelectasis at the left base. 2. Borderline to mild cardiomegaly Electronically Signed   By: Jasmine PangKim  Fujinaga M.D.   On: 06/10/2018 18:30    EKG:   Orders placed or performed during the hospital encounter of 06/10/18  . ED EKG  . ED EKG  . EKG 12-Lead  . EKG 12-Lead    IMPRESSION AND PLAN:   Brandy Schroeder  is a 84 y.o. 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  1.  Acute renal failure on CKD stage IV-increased BUN and creatinine, likely prerenal causes from increasing diuretics dose. -Gentle hydration for a day.  Hold Lasix and metolazone -Monitor labs in a.m., if no improvement consider renal ultrasound and nephrology consult.  2.  Acute cystitis-could add to confusion and weakness.  Check urine cultures and blood cultures.  Discontinue Cipro and started on Rocephin.  3.  Congestive heart failure- metolazone and Lasix are on hold.  Check echocardiogram.  Monitor while on gentle hydration  4.  Hypertension-on Norvasc  5.  Hypothyroidism-check TSH, on Synthroid  6.  DVT prophylaxis-subcutaneous heparin.  Physical therapy consult.  All the records are reviewed and case discussed with ED provider. Management plans discussed with the patient, family and they are in agreement.  CODE STATUS: DNR  TOTAL TIME TAKING CARE OF THIS PATIENT: 50 minutes.    Enid BaasKALISETTI,Shaquasia Caponigro M.D on 06/10/2018 at 7:41 PM  Between 7am to 6pm - Pager - [phone]  After 6pm go to [email or web address] - Social research officer, governmentpassword EPAS [facility]  [facility] [place] [facility]  Office  [phone]  CC: Primary care physician; Chrismon, Jodell Ciproennis E, PA

## 2018-06-10 NOTE — ED Notes (Signed)
Bair Hugger placed on patient, pt's family at bedside at this time.

## 2018-06-10 NOTE — ED Notes (Signed)
Md notified of 88.1 temperature. Bear-hugger placed on pt. MD notified.

## 2018-06-10 NOTE — ED Notes (Signed)
MD notified of bleeding from IV sites. Significant bruising noted around insertion sites.

## 2018-06-10 NOTE — ED Triage Notes (Signed)
Pt presents from assisted living via [facility] with c/o ongoing leg swelling. Facility reported that pt recently suffered from UTI and was prescribed cipro. Pt was prescribed a fluid pill as well for leg swelling on Friday. Since then the facility reported that the pt had only lost 3lbs. Facility and pt's family were concerned and wanted the pt to be further evaluated. Dyspnea only noted with exertion. Allergy to sulfa antibiotics

## 2018-06-10 NOTE — ED Notes (Signed)
Main ED pyxis out of cefepime. This RN notified pharmacy who verified they would send cefepime to the main ED.

## 2018-06-10 NOTE — ED Notes (Signed)
Date and time results received: 06/10/18 6:12 PM    Test: I-Stat Lactic Acid Critical Value: 1.69  Name of Provider Notified: Dr. Scotty CourtStafford  Orders Received? Or Actions Taken?: Orders Received - See Orders for details

## 2018-06-10 NOTE — Progress Notes (Signed)
   [facility] - [place] [facility]   Advance care planning  Hospital Day: 0 days Jean RosenthalChristine Chisholm is a 84 y.o. female presenting with Leg Swelling .   Advance care planning discussed with patient and her son-in-law who is her POA at bedside. All questions in regards to overall condition and expected prognosis answered.  Patient is very hard of hearing.  Patient has clearly mentioned that she would not want to be resuscitated if her heart were to stop.  Her son-in-law was in agreement with that. The decision was made to change her current code status to DNR  CODE STATUS: DNR Time spent: 18 minutes

## 2018-06-10 NOTE — Progress Notes (Signed)
CODE SEPSIS - PHARMACY COMMUNICATION  **Broad Spectrum Antibiotics should be administered within 1 hour of Sepsis diagnosis**  Time Code Sepsis Called/Page Received: 11/17 @1746   Antibiotics Ordered: cefepime   Time of 1st antibiotic administration: 11/17 @1823   Additional action taken by pharmacy: None  Ronnald RampKishan S Atalya Dano ,PharmD Clinical Pharmacist  06/10/2018  6:04 PM

## 2018-06-10 NOTE — ED Provider Notes (Signed)
[facility] Emergency Department Provider Note  ____________________________________________  Time seen: Approximately 6:27 PM  I have reviewed the triage vital signs and the nursing notes.   HISTORY  Chief Complaint Leg Swelling  Level 5 Caveat: Portions of the History and Physical including HPI and review of systems are unable to be completely obtained due to patient being a poor historian    HPI Brandy Schroeder is a 84 y.o. female with a history of CKD depression chronic atrial fibrillation who was sent to the ED from her nursing home today due to concerns about peripheral edema and congestive heart failure.   She was started on Lasix 2 days ago for peripheral edema as well as recent treatment with Cipro for UTI.  Despite being on Lasix 40 mg twice daily for the past few days, her weight has decreased by 3 pounds only.  Reportedly the facility felt that this was not satisfactory and sent her to the ED for further evaluation.  She does have dyspnea on exertion.     Past Medical History:  Diagnosis Date  . Anxiety   . Blind left eye   . CHF (congestive heart failure) (HCC)   . CKD (chronic kidney disease)   . Depression   . Hypothyroidism   . OA (osteoarthritis)      Patient Active Problem List   Diagnosis Date Noted  . Sepsis (HCC) 06/10/2018  . Multiple falls 09/05/2015  . Paroxysmal A-fib (HCC) 09/05/2015  . Acute on chronic renal failure (HCC) 09/05/2015  . Rhabdomyolysis 09/05/2015  . Anxiety 11/19/2014  . 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 Black stool 11/19/2014  . Degeneration of lumbar or lumbosacral intervertebral disc 11/19/2014  . Clinical depression 11/19/2014  . H/O: osteoarthritis 11/19/2014  . Adult hypothyroidism 11/19/2014  . Left leg pain 11/19/2014  . Primary localized osteoarthrosis, lower leg 11/19/2014  . Edema, peripheral 11/19/2014  . CKD (chronic kidney disease), stage IV (HCC) 11/19/2014  . Acquired spondylolisthesis  11/19/2014  . Dermatitis, stasis 11/19/2014     Past Surgical History:  Procedure Laterality Date  . ABDOMINAL HYSTERECTOMY    . CHOLECYSTECTOMY    . EYE SURGERY       Prior to Admission medications   Medication Sig Start Date End Date Taking? Authorizing Provider  acetaminophen (TYLENOL) 500 MG tablet Take 500 mg by mouth every 6 (six) hours as needed.    [provider]  Alpha-Lipoic Acid 300 MG CAPS TAKE (1) CAPSULE BY MOUTH ONCE DAILY. 01/22/18   Chrismon, Jodell Cipro, PA  amLODipine (NORVASC) 10 MG tablet TAKE 1 TABLET BY MOUTH ONCE DAILY. 01/22/18   Chrismon, Jodell Cipro, PA  ciprofloxacin (CIPRO) 500 MG tablet Take 1 tablet (500 mg total) by mouth 2 (two) times daily. Patient not taking: Reported on 06/08/2018 05/30/18   Malva Limes, MD  D3 SUPER STRENGTH 2000 units CAPS TAKE (1) CAPSULE BY MOUTH ONCE DAILY. 01/22/18   Chrismon, Jodell Cipro, PA  dextromethorphan-guaiFENesin (MUCINEX DM) 30-600 MG 12hr tablet Take 1 tablet by mouth 2 (two) times daily. 12/29/17   Chrismon, Jodell Cipro, PA  doxycycline (VIBRA-TABS) 100 MG tablet Take 1 tablet (100 mg total) by mouth 2 (two) times daily. 12/29/17   Chrismon, Jodell Cipro, PA  furosemide (LASIX) 40 MG tablet TAKE 1 TABLET BY MOUTH TWICE DAILY (MORNING AND LUNCH) 12/25/17   Chrismon, Jodell Cipro, PA  latanoprost (XALATAN) 0.005 % ophthalmic solution Place 1 drop into the right eye at bedtime.  [provider]  levothyroxine (SYNTHROID, LEVOTHROID) 50 MCG tablet TAKE 1 TABLET BY MOUTH ONCE DAILY BEFORE BREAKFAST. 12/25/17   Chrismon, Jodell Cipro, PA  loperamide (IMODIUM A-D) 2 MG tablet Take 1 tablet (2 mg total) by mouth 3 (three) times daily as needed for diarrhea or loose stools. 11/24/17   Chrismon, Jodell Cipro, PA  metolazone (ZAROXOLYN) 2.5 MG tablet Take 1 tablet (2.5 mg total) by mouth daily. Take as directed for 3 days then call report of progress. 06/08/18   Chrismon, Jodell Cipro, PA  polyethylene glycol powder (GLYCOLAX/MIRALAX) powder Take 17 g  by mouth 2 (two) times daily as needed. Mix powder in 8 ounces of Gatorade. 06/05/18   Chrismon, Jodell Cipro, PA  traZODone (DESYREL) 150 MG tablet TAKE ONE TABLET BY MOUTH AT BEDTIME. 12/25/17   Chrismon, Jodell Cipro, PA     Allergies Sulfa antibiotics   Family History  Problem Relation Age of Onset  . Arthritis Mother   . Stroke Mother   . Heart disease Brother   . Arthritis Brother     Social History Social History   Tobacco Use  . Smoking status: Never Smoker  . Smokeless tobacco: Never Used  Substance Use Topics  . Alcohol use: No    Alcohol/week: 0.0 standard drinks  . Drug use: No    Review of Systems  Constitutional:   No fever   ENT:   No sore throat. No rhinorrhea. Cardiovascular:   No chest pain or syncope. Respiratory: Positive dyspnea on exertion without cough. Gastrointestinal:   Negative for abdominal pain, vomiting and diarrhea.  Musculoskeletal:   Positive bilateral lower extremity swelling All other systems reviewed and are negative except as documented above in ROS and HPI.  ____________________________________________   PHYSICAL EXAM:  VITAL SIGNS: ED Triage Vitals  Enc Vitals Group     BP 06/10/18 1800 (!) 107/51     Pulse Rate 06/10/18 1800 (!) 55     Resp 06/10/18 1800 16     Temp 06/10/18 1800 (!) 88.1 F (31.2 C)     Temp Source 06/10/18 1800 Rectal     SpO2 06/10/18 1800 93 %     Weight 06/10/18 1730 181 lb (82.1 kg)     Height 06/10/18 1730 5\' 6"  (1.676 m)     Head Circumference --      Peak Flow --      Pain Score 06/10/18 1730 0     Pain Loc --      Pain Edu? --      Excl. in GC? --     Vital signs reviewed, nursing assessments reviewed.   Constitutional:   Alert and oriented.  Ill-appearing Eyes:   Conjunctivae are normal. EOMI. PERRL. ENT      Head:   Normocephalic and atraumatic.      Nose:   No congestion/rhinnorhea.       Mouth/Throat:   Dry mucous membranes, no pharyngeal erythema. No peritonsillar mass.       Neck:    No meningismus. Full ROM. Hematological/Lymphatic/Immunilogical:   No cervical lymphadenopathy. Cardiovascular:   RRR. Symmetric bilateral radial and DP pulses.  No murmurs. Cap refill less than 2 seconds. Respiratory:   Normal respiratory effort without tachypnea/retractions.  Bibasilar crackles, worse on the right Gastrointestinal:   Soft and nontender. Non distended. There is no CVA tenderness.  No rebound, rigidity, or guarding. Musculoskeletal:   Normal range of motion in all extremities. No joint effusions.  No lower extremity tenderness.  2+ pitting edema bilateral lower extremities Neurologic:   Somnolent.  Normal speech and language.  Motor grossly intact. No acute focal neurologic deficits are appreciated.  Skin:    Skin is warm, dry and intact. No rash noted.  No petechiae, purpura, or bullae.  ____________________________________________    LABS (pertinent positives/negatives) (all labs ordered are listed, but only abnormal results are displayed) Labs Reviewed  COMPREHENSIVE METABOLIC PANEL - Abnormal; Notable for the following components:      Result Value   Glucose, Bld 128 (*)    BUN 68 (*)    Creatinine, Ser 2.17 (*)    AST 77 (*)    ALT 94 (*)    GFR calc non Af Amer 18 (*)    GFR calc Af Amer 21 (*)    All other components within normal limits  CBC WITH DIFFERENTIAL/PLATELET - Abnormal; Notable for the following components:   Hemoglobin 11.9 (*)    RDW 15.8 (*)    Platelets 129 (*)    nRBC 0.4 (*)    Lymphs Abs 0.5 (*)    All other components within normal limits  BRAIN NATRIURETIC PEPTIDE - Abnormal; Notable for the following components:   B Natriuretic Peptide 236.0 (*)    All other components within normal limits  URINALYSIS, COMPLETE (UACMP) WITH MICROSCOPIC - Abnormal; Notable for the following components:   Color, Urine YELLOW (*)    APPearance CLOUDY (*)    Leukocytes, UA LARGE (*)    WBC, UA >50 (*)    Bacteria, UA RARE (*)    All other components  within normal limits  CULTURE, BLOOD (ROUTINE X 2)  CULTURE, BLOOD (ROUTINE X 2)  URINE CULTURE  TROPONIN I  PROCALCITONIN  PROTIME-INR  I-STAT CG4 LACTIC ACID, ED  I-STAT CG4 LACTIC ACID, ED  CG4 I-STAT (LACTIC ACID)   ____________________________________________   EKG  Interpreted by me Sinus rhythm rate of 56, normal axis and intervals.  Normal QRS ST segments and T waves.  No acute ischemic changes.  ____________________________________________    RADIOLOGY  Dg Chest Port 1 View  Result Date: 06/10/2018 CLINICAL DATA:  Lethargy EXAM: PORTABLE CHEST 1 VIEW COMPARISON:  12/29/2017 FINDINGS: Possible small pleural effusions. Linear scarring at the left base. Mild cardiomegaly with aortic atherosclerosis. Apical calcification and pleural thickening. No pneumothorax. IMPRESSION: 1. Suspected small pleural effusions with linear scarring or atelectasis at the left base. 2. Borderline to mild cardiomegaly Electronically Signed   By: Jasmine Pang M.D.   On: 06/10/2018 18:30    ____________________________________________   PROCEDURES .Critical Care Performed by: Sharman Cheek, MD Authorized by: Sharman Cheek, MD   Critical care provider statement:    Critical care time (minutes):  35   Critical care time was exclusive of:  Separately billable procedures and treating other patients   Critical care was necessary to treat or prevent imminent or life-threatening deterioration of the following conditions:  Sepsis   Critical care was time spent personally by me on the following activities:  Development of treatment plan with patient or surrogate, discussions with consultants, evaluation of patient's response to treatment, examination of patient, obtaining history from patient or surrogate, ordering and performing treatments and interventions, ordering and review of laboratory studies, ordering and review of radiographic studies, pulse oximetry, re-evaluation of patient's  condition and review of old charts    ____________________________________________  DIFFERENTIAL DIAGNOSIS   Pulmonary edema, pneumonia, pleural effusion, urinary tract infection, dehydration, likely disturbance  CLINICAL IMPRESSION / ASSESSMENT AND  PLAN / ED COURSE  Pertinent labs & imaging results that were available during my care of the patient were reviewed by me and considered in my medical decision making (see chart for details).      Clinical Course as of Jun 11 1943  Wynelle LinkSun Jun 10, 2018  1750 Patient presents for evaluation due to peripheral edema which is only minimally improved over the last 2 days despite twice daily 40 mg Lasix.  She does have basilar crackles, worse on the right.  Oxygen level is borderline low at 92% on room air.  Patient is low energy, and rectal temp is 88 degrees.  Proceed with sepsis work-up.  Concern for community acquired pneumonia versus UTI.   [PS]  1826 Consistent with UTI, failing outpatient management, causing sepsis.  Lactate is within normal, 1.7.  Blood pressure is normal.  Patient receiving cefepime.  Bear hugger.  Will admit.  No signs of shock at present time.  Urinalysis, Complete w Microscopic(!) [PS]    Clinical Course User Index [PS] Sharman CheekStafford, Evens Meno, MD     ____________________________________________   FINAL CLINICAL IMPRESSION(S) / ED DIAGNOSES    Final diagnoses:  Cystitis  Sepsis, due to unspecified organism, unspecified whether acute organ dysfunction present [facility](HCC)     ED Discharge Orders    None      Portions of this note were generated with dragon dictation software. Dictation errors may occur despite best attempts at proofreading.    Sharman CheekStafford, Emslee Lopezmartinez, MD 06/10/18 1944

## 2018-06-11 ENCOUNTER — Telehealth: Payer: Self-pay | Admitting: Family Medicine

## 2018-06-11 ENCOUNTER — Inpatient Hospital Stay (HOSPITAL_COMMUNITY)
Admit: 2018-06-11 | Discharge: 2018-06-11 | Disposition: A | Discharge status: Home / Self Care | Payer: Medicare Other | Attending: Internal Medicine | Admitting: Internal Medicine

## 2018-06-11 DIAGNOSIS — I5030 Unspecified diastolic (congestive) heart failure: Secondary | ICD-10-CM

## 2018-06-11 LAB — BASIC METABOLIC PANEL
Anion gap: 10 (ref 5–15)
BUN: 68 mg/dL — AB (ref 8–23)
CHLORIDE: 104 mmol/L (ref 98–111)
CO2: 28 mmol/L (ref 22–32)
Calcium: 8.7 mg/dL — ABNORMAL LOW (ref 8.9–10.3)
Creatinine, Ser: 2.35 mg/dL — ABNORMAL HIGH (ref 0.44–1.00)
GFR calc Af Amer: 19 mL/min — ABNORMAL LOW (ref >60)
GFR calc non Af Amer: 16 mL/min — ABNORMAL LOW (ref >60)
GLUCOSE: 85 mg/dL (ref 70–99)
POTASSIUM: 4 mmol/L (ref 3.5–5.1)
Sodium: 142 mmol/L (ref 135–145)

## 2018-06-11 LAB — CBC
HEMATOCRIT: 32.1 % — AB (ref 36.0–46.0)
HEMOGLOBIN: 10.6 g/dL — AB (ref 12.0–15.0)
MCH: 30.3 pg (ref 26.0–34.0)
MCHC: 33 g/dL (ref 30.0–36.0)
MCV: 91.7 fL (ref 80.0–100.0)
Platelets: 115 10*3/uL — ABNORMAL LOW (ref 150–400)
RBC: 3.5 MIL/uL — ABNORMAL LOW (ref 3.87–5.11)
RDW: 15.6 % — ABNORMAL HIGH (ref 11.5–15.5)
WBC: 4.8 10*3/uL (ref 4.0–10.5)
nRBC: 0.8 % — ABNORMAL HIGH (ref 0.0–0.2)

## 2018-06-11 LAB — T4, FREE: Free T4: 1.06 ng/dL (ref 0.82–1.77)

## 2018-06-11 MED ORDER — SODIUM CHLORIDE 0.9 % IV SOLN
INTRAVENOUS | Status: AC
  Administered 2018-06-11 – 2018-06-12 (×2): via INTRAVENOUS

## 2018-06-11 MED ORDER — SODIUM CHLORIDE 0.9 % IV BOLUS
500.0000 mL | Freq: Once | INTRAVENOUS | Status: AC
  Administered 2018-06-11: 491 mL via INTRAVENOUS

## 2018-06-11 MED ORDER — SODIUM CHLORIDE 0.9 % IV BOLUS
500.0000 mL | Freq: Once | INTRAVENOUS | Status: AC
  Administered 2018-06-11: 500 mL via INTRAVENOUS

## 2018-06-11 MED ORDER — ALBUMIN HUMAN 25 % IV SOLN
50.0000 g | Freq: Once | INTRAVENOUS | Status: AC
  Administered 2018-06-11: 50 g via INTRAVENOUS
  Filled 2018-06-11: qty 200

## 2018-06-11 MED ORDER — TRAZODONE HCL 50 MG PO TABS: 50.0000 mg | ORAL_TABLET | Freq: Every evening | ORAL | Status: DC | PRN

## 2018-06-11 MED ORDER — SODIUM CHLORIDE 0.9 % IV BOLUS: 500.0000 mL | Freq: Once | INTRAVENOUS | Status: DC

## 2018-06-11 NOTE — Progress Notes (Signed)
Bear hugger placed on pt

## 2018-06-11 NOTE — Progress Notes (Addendum)
Temp 99.8 axillary, bearhugger removed

## 2018-06-11 NOTE — Progress Notes (Signed)
500mL NS bolus for BP of 76/39 per Dr. Nemiah CommanderKalisetti

## 2018-06-11 NOTE — Progress Notes (Signed)
*  PRELIMINARY RESULTS* Echocardiogram 2D Echocardiogram has been performed.  Cristela BlueHege, Toshua Honsinger 06/11/2018, 3:25 PM

## 2018-06-11 NOTE — Telephone Encounter (Signed)
Acknowledged.

## 2018-06-11 NOTE — Evaluation (Signed)
Physical Therapy Evaluation Patient Details Name: Brandy Schroeder MRN: [id number] DOB: 08-05-1918 Today's Date: 06/11/2018   History of Present Illness  Pt is a 84 y/o F who presented from ALF with dypnea, worsening pedal edema, persistent UTI. Pt's PMH includes blind L eye, CHF.      Clinical Impression  Pt admitted with above diagnosis. Pt currently with functional limitations due to the deficits listed below (see PT Problem List). Brandy Schroeder was awake and alert upon PT arrival (although does appear mildly lethargic).  Caregiver reports the pt just woke up from a nap.  Pt very HOH but was able to follow simple commands for gross strength assessment and to participate in light exercises in supine.  Pt becomes gradually more sleepy and lethargic as session progresses.  Vitals monitored closely throughout session and RN notified of worrisome BP.  Pt remained on 2L O2 throughout session.  Supine at start of session: BP 135/108 R arm, SpO2 90%, pulse 83.  BP retaken on L arm 93/55.  Pt participated in AAROM exercises and AROM exercises. BP taken in supine after exercises: 83/41 L arm, SpO2 90%, pulse 82.  BP retaken in L arm: 93/46.  RN notified of low BP readings. Caregiver reports that readings have always been asymmetrical with LUE readings consistently less compared to RUE at baseline. Pt still following simple commands at end of session but gets progressively more lethargic throughout session. Given pt's current mobility status, recommending SNF at d/c.  Pt will benefit from skilled PT to increase their independence and safety with mobility to allow discharge to the venue listed below.      Follow Up Recommendations SNF    Equipment Recommendations  Other (comment)(TBD at next venue of care)    Recommendations for Other Services       Precautions / Restrictions Precautions Precautions: Fall;Other (comment) Precaution Comments: O2, monitor BP Restrictions Weight Bearing Restrictions: No       Mobility  Bed Mobility               General bed mobility comments: Unable to assess this session due to lethargy  Transfers                    Ambulation/Gait                Stairs            Wheelchair Mobility    Modified Rankin (Stroke Patients Only)       Balance                                             Pertinent Vitals/Pain Pain Assessment: Faces Faces Pain Scale: Hurts a little bit Pain Location: in arm with BP cuff inflation Pain Descriptors / Indicators: Discomfort;Moaning Pain Intervention(s): Limited activity within patient's tolerance;Monitored during session    Home Living Family/patient expects to be discharged to:: Assisted living Living Arrangements: Alone Available Help at Discharge: Personal care attendant;Available 24 hours/day(supervision 24/7) Type of Home: Assisted living Home Access: Level entry     Home Layout: One level Home Equipment: Walker - 4 wheels;Shower seat;Wheelchair - manual;Grab bars - tub/shower;Grab bars - toilet;Bedside commode Additional Comments: Caregiver reports the pt needs to be able to ambulate (can be with assist) to return to ALF    Prior Function Level of Independence: Needs assistance  Gait / Transfers Assistance Needed: Ambulates with RW and has an orthotic due to LLD.  Ambulating shorter household distances at baseline.  No recent falls.    ADL's / Homemaking Assistance Needed: Pt requires assist with showering (gets into the shower), bathing, dressing.  Ind with feeding.  Comments: Pt does chair exercises with staff every day.      Hand Dominance        Extremity/Trunk Assessment   Upper Extremity Assessment Upper Extremity Assessment: Generalized weakness    Lower Extremity Assessment Lower Extremity Assessment: Generalized weakness       Communication   Communication: HOH  Cognition Arousal/Alertness: Lethargic Behavior During Therapy:  Flat affect;Restless Overall Cognitive Status: Difficult to assess                                 General Comments: Pt very HOH but caregiver reports she normally has no confusion at baseline.  Pt restless and pulling at gown and attempting to get OOB upon PT arrival.       General Comments General comments (skin integrity, edema, etc.): Caregiver from ALF present during session. Vitals taken throughout session with pt remaining on 2L O2.  Supine at start of session: BP 135/108 R arm, SpO2 90%, pulse 83.  BP retaken on L arm 93/55.  Pt participated in AAROM exercises and AROM exercises. BP taken in supine after exercises: 83/41 L arm, SpO2 90%, pulse 82.  BP retaken in L arm: 93/46.  RN notified of low BP readings.  Pt still following simple commands at end of session but gets progressively more lethargic throughout session.     Exercises General Exercises - Upper Extremity Shoulder Flexion: AAROM;Both;10 reps;Supine Elbow Flexion: AROM;AAROM;Both;10 reps;Supine Wrist Flexion: AAROM;Both;10 reps;Supine Wrist Extension: AAROM;Both;10 reps;Supine General Exercises - Lower Extremity Ankle Circles/Pumps: AROM;Both;10 reps;Supine Heel Slides: AAROM;Both;10 reps;Supine Straight Leg Raises: AAROM;Both;Other reps (comment)(2 reps)   Assessment/Plan    PT Assessment Patient needs continued PT services  PT Problem List Decreased strength;Decreased activity tolerance;Decreased balance;Decreased mobility;Decreased cognition;Decreased knowledge of use of DME;Decreased safety awareness;Cardiopulmonary status limiting activity       PT Treatment Interventions DME instruction;Gait training;Functional mobility training;Therapeutic activities;Therapeutic exercise;Balance training;Neuromuscular re-education;Cognitive remediation;Patient/family education;Wheelchair mobility training    PT Goals (Current goals can be found in the Care Plan section)  Acute Rehab PT Goals Patient Stated Goal:  unable to state due to lethargy PT Goal Formulation: Patient unable to participate in goal setting Time For Goal Achievement: 06/25/18 Potential to Achieve Goals: Fair    Frequency Min 2X/week   Barriers to discharge Decreased caregiver support Pt needs to be able to ambulate to return to ALF    Co-evaluation               AM-PAC PT "6 Clicks" Daily Activity  Outcome Measure Difficulty turning over in bed (including adjusting bedclothes, sheets and blankets)?: Unable Difficulty moving from lying on back to sitting on the side of the bed? : Unable Difficulty sitting down on and standing up from a chair with arms (e.g., wheelchair, bedside commode, etc,.)?: Unable Help needed moving to and from a bed to chair (including a wheelchair)?: Total Help needed walking in hospital room?: Total Help needed climbing 3-5 steps with a railing? : Total 6 Click Score: 6    End of Session Equipment Utilized During Treatment: Oxygen Activity Tolerance: Patient limited by lethargy Patient left: in bed;with call bell/phone within reach;with bed  alarm set;with family/visitor present Nurse Communication: Mobility status;Other (comment)(SpO2 and BP) PT Visit Diagnosis: Muscle weakness (generalized) (M62.81);Difficulty in walking, not elsewhere classified (R26.2)    Time: 1610-9604 PT Time Calculation (min) (ACUTE ONLY): 36 min   Charges:   PT Evaluation $PT Eval Moderate Complexity: 1 Mod PT Treatments $Therapeutic Exercise: 8-22 mins        Session was performed by student PT, Lisbeth Renshaw, and directed, overseen, and documented by this PT.  Encarnacion Chu PT, DPT  06/11/2018, 2:46 PM

## 2018-06-11 NOTE — Progress Notes (Signed)
[employer] [facility] [place] at [facility]   PATIENT NAME: Brandy Schroeder    MR#:  [id number]  DATE OF BIRTH:  September 04, 1918  SUBJECTIVE:  CHIEF COMPLAINT:   Chief Complaint  Patient presents with  . Leg Swelling   -More drowsy today.  Able to open her eyes and state her name.  Not following simple commands  REVIEW OF SYSTEMS:  Review of Systems  Unable to perform ROS: Mental status change    DRUG ALLERGIES:   Allergies  Allergen Reactions  . Sulfa Antibiotics     VITALS:  Blood pressure (!) 112/56, pulse 90, temperature 97.6 F (36.4 C), resp. rate 16, height 5\' 6"  (1.676 m), weight 82.5 kg, SpO2 93 %.  PHYSICAL EXAMINATION:  Physical Exam  GENERAL:  84 y.o.-year-old elderly patient lying in the bed with no acute distress.  Patient is very hard of hearing Appears dyspneic EYES: Left pupil is dilated and not reacting to light.  Right pupil is reacting to light.. No scleral icterus. Extraocular muscles intact.  HEENT: Head atraumatic, normocephalic. Oropharynx and nasopharynx clear.  NECK:  Supple, no jugular venous distention. No thyroid enlargement, no tenderness.  LUNGS: Normal breath sounds bilaterally, no wheezing, rales,rhonchi or crepitation.  Appears dyspneic.  No use of accessory muscles of respiration.  Decreased bibasilar breath sounds. CARDIOVASCULAR: S1, S2 normal. No  rubs, or gallops.  2/6 systolic murmur is present ABDOMEN: Soft, nontender, nondistended. Bowel sounds present. No organomegaly or mass.  EXTREMITIES: No  cyanosis, or clubbing.  2+ bilateral pedal edema noted NEUROLOGIC: Cranial nerves II through XII are intact.  Patient is very hard of hearing.  No change in motor strength or sensation.  Gait not checked.  Global weakness noted PSYCHIATRIC: The patient is drowsy but easily arousable. SKIN: No obvious rash, lesion, or ulcer   LABORATORY PANEL:   CBC Recent Labs  Lab 06/11/18 0507  WBC 4.8  HGB 10.6*  HCT 32.1*  PLT 115*     ------------------------------------------------------------------------------------------------------------------  Chemistries  Recent Labs  Lab 06/10/18 1750 06/11/18 0507  NA 141 142  K 4.6 4.0  CL 103 104  CO2 28 28  GLUCOSE 128* 85  BUN 68* 68*  CREATININE 2.17* 2.35*  CALCIUM 9.4 8.7*  AST 77*  --   ALT 94*  --   ALKPHOS 71  --   BILITOT 0.7  --    ------------------------------------------------------------------------------------------------------------------  Cardiac Enzymes Recent Labs  Lab 06/10/18 1750  TROPONINI <0.03   ------------------------------------------------------------------------------------------------------------------  RADIOLOGY:  Dg Chest Port 1 View  Result Date: 06/10/2018 CLINICAL DATA:  Lethargy EXAM: PORTABLE CHEST 1 VIEW COMPARISON:  12/29/2017 FINDINGS: Possible small pleural effusions. Linear scarring at the left base. Mild cardiomegaly with aortic atherosclerosis. Apical calcification and pleural thickening. No pneumothorax. IMPRESSION: 1. Suspected small pleural effusions with linear scarring or atelectasis at the left base. 2. Borderline to mild cardiomegaly Electronically Signed   By: Jasmine Pang M.D.   On: 06/10/2018 18:30    EKG:   Orders placed or performed during the hospital encounter of 06/10/18  . ED EKG  . ED EKG  . EKG 12-Lead  . EKG 12-Lead    ASSESSMENT AND PLAN:   Brandy Schroeder  is a 84 y.o. 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  1.  Acute renal failure on CKD stage IV-increased BUN and creatinine, ATN and prerenal causes from increasing diuretics dose. - Hold Lasix and metolazone -IV fluids held due to dyspnea.  Check renal ultrasound and will get  nephrology consult as creatinine is not improving.  2.  Acute cystitis-could add to confusion and weakness.  Check urine cultures and blood cultures pending.  Discontinue Cipro and on Rocephin.  3.  Congestive heart failure- metolazone and Lasix are on hold.  Check echocardiogram.   4.  Hypertension-on Norvasc- hold due to hypotension  5.  Hypothyroidism-elevated TSH, on Synthroid Check T4  6.  DVT prophylaxis-subcutaneous heparin.  7.  Acute metabolic encephalopathy-secondary to sepsis, trazodone last night and also renal failure.  Discontinue trazodone -Continue to monitor closely  Physical therapy consult. Family updated at bedside    All the records are reviewed and case discussed with Care Management/Social Workerr. Management plans discussed with the patient, family and they are in agreement.  CODE STATUS: Full code  TOTAL TIME TAKING CARE OF THIS PATIENT: 38 minutes.   POSSIBLE D/C IN 2-3 DAYS, DEPENDING ON CLINICAL CONDITION.   Brandy Schroeder,Brandy Schroeder M.D on 06/11/2018 at 11:17 AM  Between 7am to 6pm - Pager - [phone]  After 6pm go to [email or web address] - Social research officer, governmentpassword [email or web address]  [facility]  Office  [phone]  CC: Primary care physician; Chrismon, Jodell Ciproennis E, PA

## 2018-06-11 NOTE — Telephone Encounter (Signed)
FYI

## 2018-06-11 NOTE — Telephone Encounter (Signed)
[name] with [name] stated she was supposed to call back today to give Brandy MinisterDennis an update about the pt since pt was seen on 06/08/18. [name] stated they had to take pt to ER 06/10/18 and pt was admitted and she wanted to let [place] know. Please advise. Thanks TNP

## 2018-06-11 NOTE — Consult Note (Signed)
[facility]  CONSULT NOTE    Date: 06/11/2018                  Patient Name:  Brandy Schroeder  MRN: [id number]  DOB: [birth year]  Age / Sex: 84 y.o., female         PCP: Tamsen Roers, PA                 Service Requesting Consult: Dr. Nemiah Commander                 Reason for Consult: Acute renal failure            History of Present Illness: Brandy Schroeder is a 84 y.o. white female with congestive heart failure, depression, hypothyroidism, arthritis, left eye blindness, glaucoma, who was admitted to [facility] on 06/10/2018 for Leg swelling [M79.89] Cystitis [N30.90] Sepsis, due to unspecified organism, unspecified whether acute organ dysfunction present [facility]) [A41.9]  Several family members present at bedside. Patient took trazodone and has not woken up since. She is moving all extremities.   Seems that patient was found to be in acute exacerbation of systolic congestive heart failure. Started on metolazone. Baseline creatinine of 1.55, Creatinine today is 2.35.    Medications: Outpatient medications: Medications Prior to Admission  Medication Sig Dispense Refill Last Dose  . acetaminophen (TYLENOL) 500 MG tablet Take 500 mg by mouth every 6 (six) hours as needed.   Taking  . Alpha-Lipoic Acid 300 MG CAPS TAKE (1) CAPSULE BY MOUTH ONCE DAILY. 90 capsule 2 Taking  . amLODipine (NORVASC) 10 MG tablet TAKE 1 TABLET BY MOUTH ONCE DAILY. 90 tablet 2 Taking  . ciprofloxacin (CIPRO) 500 MG tablet Take 1 tablet (500 mg total) by mouth 2 (two) times daily. (Patient not taking: Reported on 06/08/2018) 14 tablet 0 Not Taking  . D3 SUPER STRENGTH 2000 units CAPS TAKE (1) CAPSULE BY MOUTH ONCE DAILY. 90 capsule 2 Taking  . dextromethorphan-guaiFENesin (MUCINEX DM) 30-600 MG 12hr tablet Take 1 tablet by mouth 2 (two) times daily. 20 tablet 0 Taking  . doxycycline (VIBRA-TABS) 100 MG tablet Take 1 tablet (100 mg total) by mouth 2 (two) times daily. 20 tablet 0  Taking  . furosemide (LASIX) 40 MG tablet TAKE 1 TABLET BY MOUTH TWICE DAILY (MORNING AND LUNCH) 180 tablet 1 Taking  . latanoprost (XALATAN) 0.005 % ophthalmic solution Place 1 drop into the right eye at bedtime.    Taking  . levothyroxine (SYNTHROID, LEVOTHROID) 50 MCG tablet TAKE 1 TABLET BY MOUTH ONCE DAILY BEFORE BREAKFAST. 90 tablet 3 Taking  . loperamide (IMODIUM A-D) 2 MG tablet Take 1 tablet (2 mg total) by mouth 3 (three) times daily as needed for diarrhea or loose stools. 12 tablet 0 Taking  . metolazone (ZAROXOLYN) 2.5 MG tablet Take 1 tablet (2.5 mg total) by mouth daily. Take as directed for 3 days then call report of progress. 30 tablet 0   . polyethylene glycol powder (GLYCOLAX/MIRALAX) powder Take 17 g by mouth 2 (two) times daily as needed. Mix powder in 8 ounces of Gatorade. 116 g 1 Taking  . traZODone (DESYREL) 150 MG tablet TAKE ONE TABLET BY MOUTH AT BEDTIME. 90 tablet 1 Taking    Current medications: Current Facility-Administered Medications  Medication Dose Route Frequency Provider Last Rate Last Dose  . acetaminophen (TYLENOL) tablet 650 mg  650 mg Oral Q6H PRN Enid Baas, MD       Or  . acetaminophen (  TYLENOL) suppository 650 mg  650 mg Rectal Q6H PRN Enid Baas, MD      . cefTRIAXone (ROCEPHIN) 1 g in sodium chloride 0.9 % 100 mL IVPB  1 g Intravenous Q24H Enid Baas, MD 200 mL/hr at 06/11/18 0846 1 g at 06/11/18 0846  . heparin injection 5,000 Units  5,000 Units Subcutaneous Q8H Enid Baas, MD   5,000 Units at 06/11/18 [phone]  . latanoprost (XALATAN) 0.005 % ophthalmic solution 1 drop  1 drop Right Eye QHS Kalisetti, [place], MD      . levothyroxine (SYNTHROID, LEVOTHROID) tablet 50 mcg  50 mcg Oral Q0600 Enid Baas, MD   50 mcg at 06/11/18 0615  . ondansetron (ZOFRAN) tablet 4 mg  4 mg Oral Q6H PRN Enid Baas, MD       Or  . ondansetron (ZOFRAN) injection 4 mg  4 mg Intravenous Q6H PRN Enid Baas, MD      .  polyethylene glycol (MIRALAX / GLYCOLAX) packet 17 g  17 g Oral BID PRN Hallaji, Sheema M, RPH          Allergies: Allergies  Allergen Reactions  . Sulfa Antibiotics       Past Medical History: Past Medical History:  Diagnosis Date  . Anxiety   . Blind left eye   . CHF (congestive heart failure) (HCC)   . CKD (chronic kidney disease)   . Depression   . Hypothyroidism   . OA (osteoarthritis)      Past Surgical History: Past Surgical History:  Procedure Laterality Date  . ABDOMINAL HYSTERECTOMY    . CHOLECYSTECTOMY    . EYE SURGERY       Family History: Family History  Problem Relation Age of Onset  . Arthritis Mother   . Stroke Mother   . Heart disease Brother   . Arthritis Brother      Social History: Social History   Socioeconomic History  . Marital status: Married    Spouse name: Not on file  . Number of children: Not on file  . Years of education: Not on file  . Highest education level: Not on file  Occupational History  . Not on file  Social Needs  . Financial resource strain: Not on file  . Food insecurity:    Worry: Not on file    Inability: Not on file  . Transportation needs:    Medical: Not on file    Non-medical: Not on file  Tobacco Use  . Smoking status: Never Smoker  . Smokeless tobacco: Never Used  Substance and Sexual Activity  . Alcohol use: No    Alcohol/week: 0.0 standard drinks  . Drug use: No  . Sexual activity: Not on file  Lifestyle  . Physical activity:    Days per week: Not on file    Minutes per session: Not on file  . Stress: Not on file  Relationships  . Social connections:    Talks on phone: Not on file    Gets together: Not on file    Attends religious service: Not on file    Active member of club or organization: Not on file    Attends meetings of clubs or organizations: Not on file    Relationship status: Not on file  . Intimate partner violence:    Fear of current or ex partner: Not on file     Emotionally abused: Not on file    Physically abused: Not on file    Forced sexual activity: Not  on file  Other Topics Concern  . Not on file  Social History Narrative   Patient is from an assisted living facility.  Oriented at baseline and also ambulates with a walker.     Review of Systems: Review of Systems  Unable to perform ROS: Mental status change    Vital Signs: Blood pressure (!) 76/36, pulse 71, temperature 97.6 F (36.4 C), resp. rate 16, height 5\' 6"  (1.676 m), weight 82.5 kg, SpO2 91 %.  Weight trends: Filed Weights   06/10/18 1730 06/10/18 2057  Weight: 82.1 kg 82.5 kg    Physical Exam: General: NAD, laying in bed  Head: Normocephalic, atraumatic. Moist oral mucosal membranes  Eyes: Anicteric, PERRL  Neck: Supple, trachea midline  Lungs:  Clear to auscultation  Heart: Regular rate and rhythm  Abdomen:  Soft, nontender,   Extremities:  trace peripheral edema.  Neurologic: Moving all four extremities, somnulent.   Skin: No lesions         Lab results: Basic Metabolic Panel: Recent Labs  Lab 06/10/18 1750 06/11/18 0507  NA 141 142  K 4.6 4.0  CL 103 104  CO2 28 28  GLUCOSE 128* 85  BUN 68* 68*  CREATININE 2.17* 2.35*  CALCIUM 9.4 8.7*    Liver Function Tests: Recent Labs  Lab 06/10/18 1750  AST 77*  ALT 94*  ALKPHOS 71  BILITOT 0.7  PROT 7.2  ALBUMIN 3.9   No results for input(s): LIPASE, AMYLASE in the last 168 hours. No results for input(s): AMMONIA in the last 168 hours.  CBC: Recent Labs  Lab 06/10/18 1750 06/11/18 0507  WBC 5.6 4.8  NEUTROABS 4.7  --   HGB 11.9* 10.6*  HCT 36.9 32.1*  MCV 93.9 91.7  PLT 129* 115*    Cardiac Enzymes: Recent Labs  Lab 06/10/18 1750  TROPONINI <0.03    BNP: Invalid input(s): POCBNP  CBG: No results for input(s): GLUCAP in the last 168 hours.  Microbiology: Results for orders placed or performed during the hospital encounter of 06/10/18  Blood Culture (routine x 2)      Status: None (Preliminary result)   Collection Time: 06/10/18  5:52 PM  Result Value Ref Range Status   Specimen Description BLOOD RFA  Final   Special Requests   Final    BOTTLES DRAWN AEROBIC AND ANAEROBIC Blood Culture adequate volume   Culture   Final    NO GROWTH < 24 HOURS Performed at [facility], [address], [place], [place] [address]    Report Status PENDING  Incomplete  Blood Culture (routine x 2)     Status: None (Preliminary result)   Collection Time: 06/10/18  5:52 PM  Result Value Ref Range Status   Specimen Description BLOOD RAC  Final   Special Requests   Final    BOTTLES DRAWN AEROBIC AND ANAEROBIC Blood Culture adequate volume   Culture   Final    NO GROWTH < 24 HOURS Performed at [facility], [address] [place], [place] [address]    Report Status PENDING  Incomplete    Coagulation Studies: Recent Labs    06/10/18 1750  LABPROT 12.9  INR 0.98    Urinalysis: Recent Labs    06/10/18 1750  COLORURINE YELLOW*  LABSPEC 1.009  PHURINE 5.0  GLUCOSEU NEGATIVE  HGBUR NEGATIVE  BILIRUBINUR NEGATIVE  KETONESUR NEGATIVE  PROTEINUR NEGATIVE  NITRITE NEGATIVE  LEUKOCYTESUR LARGE*      Imaging: Dg Chest Port 1 [address]  Result Date: 06/10/2018 CLINICAL DATA:  Lethargy EXAM: PORTABLE CHEST 1 VIEW COMPARISON:  12/29/2017 FINDINGS: Possible small pleural effusions. Linear scarring at the left base. Mild cardiomegaly with aortic atherosclerosis. Apical calcification and pleural thickening. No pneumothorax. IMPRESSION: 1. Suspected small pleural effusions with linear scarring or atelectasis at the left base. 2. Borderline to mild cardiomegaly Electronically Signed   By: Jasmine PangKim  Fujinaga M.D.   On: 06/10/2018 18:30      Assessment & Plan: Brandy Schroeder is a 84 y.o. white female with congestive heart failure, depression, hypothyroidism, arthritis, left eye blindness, glaucoma, who was admitted to [facility] on 06/10/2018  1. Acute  Renal Failure on chronic kidney disease stage IV. Baseline creatinine of 1.55, GFR of 28 on 12/29/17.  Acute renal failure most likely from overdiuresis versus ATN.  - Holding IV fluids due to congestive heart failure.  - Hold diuretics.  - renal ultrasound.   2. Anemia of chronic kidney disease: hemoglobin 10.6  3. Acute exacerbation of systolic congestive heart failure - echocardiogram for today   LOS: 1 Kymberley Raz 11/18/20192:03 PM

## 2018-06-11 NOTE — Progress Notes (Signed)
Per MD okay for RN order NS 500 ML bolus.

## 2018-06-11 NOTE — Progress Notes (Signed)
06/11/18 1200  Clinical Encounter Type  Visited With Patient and family together  Visit Type Initial;Spiritual support  Recommendations Follow-up as requested.  Stress Factors  Family Stress Factors Health changes (Patient's change of health.)   Chaplain met with the patient and family. The patient was not awake, and the focus of the encounter was on her family. After conversation about the patient, family farm, and concerns about the future, Chaplain provided a blessing to the patient.

## 2018-06-12 ENCOUNTER — Inpatient Hospital Stay: Payer: Medicare Other

## 2018-06-12 ENCOUNTER — Other Ambulatory Visit: Payer: Self-pay | Admitting: Family Medicine

## 2018-06-12 DIAGNOSIS — R001 Bradycardia, unspecified: Secondary | ICD-10-CM

## 2018-06-12 DIAGNOSIS — R609 Edema, unspecified: Secondary | ICD-10-CM

## 2018-06-12 LAB — CBC
HCT: 32.1 % — ABNORMAL LOW (ref 36.0–46.0)
HEMOGLOBIN: 10.2 g/dL — AB (ref 12.0–15.0)
MCH: 30.2 pg (ref 26.0–34.0)
MCHC: 31.8 g/dL (ref 30.0–36.0)
MCV: 95 fL (ref 80.0–100.0)
Platelets: 89 10*3/uL — ABNORMAL LOW (ref 150–400)
RBC: 3.38 MIL/uL — ABNORMAL LOW (ref 3.87–5.11)
RDW: 15.9 % — ABNORMAL HIGH (ref 11.5–15.5)
WBC: 4.5 10*3/uL (ref 4.0–10.5)
nRBC: 0.4 % — ABNORMAL HIGH (ref 0.0–0.2)

## 2018-06-12 LAB — BASIC METABOLIC PANEL
Anion gap: 12 (ref 5–15)
BUN: 68 mg/dL — AB (ref 8–23)
CHLORIDE: 108 mmol/L (ref 98–111)
CO2: 26 mmol/L (ref 22–32)
CREATININE: 2.31 mg/dL — AB (ref 0.44–1.00)
Calcium: 8.5 mg/dL — ABNORMAL LOW (ref 8.9–10.3)
GFR calc Af Amer: 19 mL/min — ABNORMAL LOW (ref >60)
GFR calc non Af Amer: 17 mL/min — ABNORMAL LOW (ref >60)
Glucose, Bld: 79 mg/dL (ref 70–99)
Potassium: 3.5 mmol/L (ref 3.5–5.1)
Sodium: 146 mmol/L — ABNORMAL HIGH (ref 135–145)

## 2018-06-12 LAB — ECHOCARDIOGRAM COMPLETE
Height: 66 in
Weight: 2910.07 oz

## 2018-06-12 LAB — CORTISOL: Cortisol, Plasma: >100 ug/dL

## 2018-06-12 LAB — T4, FREE: Free T4: 1.08 ng/dL (ref 0.82–1.77)

## 2018-06-12 MED ORDER — SODIUM CHLORIDE 0.9 % IV BOLUS
250.0000 mL | Freq: Once | INTRAVENOUS | Status: AC
  Administered 2018-06-12: 250 mL via INTRAVENOUS

## 2018-06-12 MED ORDER — SODIUM CHLORIDE 0.9 % IV SOLN
INTRAVENOUS | Status: DC
  Administered 2018-06-12 – 2018-06-13 (×2): via INTRAVENOUS

## 2018-06-12 MED ORDER — HYDROCORTISONE NA SUCCINATE PF 100 MG IJ SOLR
100.0000 mg | Freq: Three times a day (TID) | INTRAMUSCULAR | Status: DC
  Administered 2018-06-12 – 2018-06-13 (×3): 100 mg via INTRAVENOUS
  Filled 2018-06-12 (×3): qty 2

## 2018-06-12 NOTE — Progress Notes (Signed)
[facility]  ROUNDING NOTE   Subjective:   Family at bedside.   Patient was hypotensive yesterday and started on IV fluids Patient was hypothermic this morning and started on warming blanket.  Patient had episode of bradycardia this morning  Patient is very lethargic. Family states she was awake all night.   Objective:  Vital signs in last 24 hours:  Temp:  [93.7 F (34.3 C)-97.7 F (36.5 C)] 97.4 F (36.3 C) (11/19 1136) Pulse Rate:  [52-86] 52 (11/19 1136) Resp:  [15-22] 15 (11/19 1136) BP: (76-121)/(36-76) 121/45 (11/19 1136) SpO2:  [84 %-94 %] 84 % (11/19 1136)  Weight change:  Filed Weights   06/10/18 1730 06/10/18 2057  Weight: 82.1 kg 82.5 kg    Intake/Output: I/O last 3 completed shifts: In: 2108 [P.O.:60; I.V.:753; IV Piggyback:1295] Out: 1150 [Urine:1150]   Intake/Output this shift:  Total I/O In: 240 [P.O.:240] Out: 0   Physical Exam: General: NAD,   Head: Normocephalic, atraumatic. Moist oral mucosal membranes  Eyes: Anicteric, PERRL  Neck: Supple, trachea midline  Lungs:  Clear to auscultation  Heart: Regular rate and rhythm  Abdomen:  Soft, nontender,   Extremities: No peripheral edema.  Neurologic: Somnolent.   Skin: No lesions        Basic Metabolic Panel: Recent Labs  Lab 06/10/18 1750 06/11/18 0507 06/12/18 0322  NA 141 142 146*  K 4.6 4.0 3.5  CL 103 104 108  CO2 28 28 26   GLUCOSE 128* 85 79  BUN 68* 68* 68*  CREATININE 2.17* 2.35* 2.31*  CALCIUM 9.4 8.7* 8.5*    Liver Function Tests: Recent Labs  Lab 06/10/18 1750  AST 77*  ALT 94*  ALKPHOS 71  BILITOT 0.7  PROT 7.2  ALBUMIN 3.9   No results for input(s): LIPASE, AMYLASE in the last 168 hours. No results for input(s): AMMONIA in the last 168 hours.  CBC: Recent Labs  Lab 06/10/18 1750 06/11/18 0507  WBC 5.6 4.8  NEUTROABS 4.7  --   HGB 11.9* 10.6*  HCT 36.9 32.1*  MCV 93.9 91.7  PLT 129* 115*    Cardiac Enzymes: Recent Labs  Lab  06/10/18 1750  TROPONINI <0.03    BNP: Invalid input(s): POCBNP  CBG: No results for input(s): GLUCAP in the last 168 hours.  Microbiology: Results for orders placed or performed during the hospital encounter of 06/10/18  Urine culture     Status: Abnormal (Preliminary result)   Collection Time: 06/10/18  5:50 PM  Result Value Ref Range Status   Specimen Description   Final    URINE, RANDOM Performed at [facility], [address] [place], [place] [address]    Special Requests   Final    NONE Performed at [facility], [address] [place], [place] [address]    Culture >=100,000 COLONIES/mL PROTEUS MIRABILIS (A)  Final   Report Status PENDING  Incomplete  Blood Culture (routine x 2)     Status: None (Preliminary result)   Collection Time: 06/10/18  5:52 PM  Result Value Ref Range Status   Specimen Description BLOOD RFA  Final   Special Requests   Final    BOTTLES DRAWN AEROBIC AND ANAEROBIC Blood Culture adequate volume   Culture   Final    NO GROWTH 2 DAYS Performed at [facility], [address] [place], [place] [address]    Report Status PENDING  Incomplete  Blood Culture (routine x 2)     Status: None (  Preliminary result)   Collection Time: 06/10/18  5:52 PM  Result Value Ref Range Status   Specimen Description BLOOD RAC  Final   Special Requests   Final    BOTTLES DRAWN AEROBIC AND ANAEROBIC Blood Culture adequate volume   Culture   Final    NO GROWTH 2 DAYS Performed at [facility], [address] [place], [place] [address]    Report Status PENDING  Incomplete    Coagulation Studies: Recent Labs    06/10/18 1750  LABPROT 12.9  INR 0.98    Urinalysis: Recent Labs    06/10/18 1750  COLORURINE YELLOW*  LABSPEC 1.009  PHURINE 5.0  GLUCOSEU NEGATIVE  HGBUR NEGATIVE  BILIRUBINUR NEGATIVE  KETONESUR NEGATIVE  PROTEINUR NEGATIVE  NITRITE NEGATIVE  LEUKOCYTESUR LARGE*      Imaging: [place]  Renal  Result Date: 06/12/2018 CLINICAL DATA:  Acute renal failure. EXAM: RENAL / URINARY TRACT ULTRASOUND COMPLETE COMPARISON:  None. FINDINGS: Right Kidney: Renal measurements: 7.9 x 3.6 x 4.3 cm = volume: 65 mL. Renal cortical thinning with borderline increased parenchymal echogenicity. No mass or hydronephrosis identified. Left Kidney: Renal measurements: 8.3 x 4.8 x 5.7 cm = volume: 118 mL. Mildly increased parenchymal echogenicity. No mass or hydronephrosis identified. Bladder: Not visualized. IMPRESSION: Evidence of medical renal disease without hydronephrosis. Electronically Signed   By: Sebastian AcheAllen  Grady M.D.   On: 06/12/2018 10:08   Dg Chest Port 1 View  Result Date: 06/10/2018 CLINICAL DATA:  Lethargy EXAM: PORTABLE CHEST 1 VIEW COMPARISON:  12/29/2017 FINDINGS: Possible small pleural effusions. Linear scarring at the left base. Mild cardiomegaly with aortic atherosclerosis. Apical calcification and pleural thickening. No pneumothorax. IMPRESSION: 1. Suspected small pleural effusions with linear scarring or atelectasis at the left base. 2. Borderline to mild cardiomegaly Electronically Signed   By: Jasmine PangKim  Fujinaga M.D.   On: 06/10/2018 18:30     Medications:   . sodium chloride 60 mL/hr at 06/12/18 0400  . cefTRIAXone (ROCEPHIN)  IV 1 g (06/12/18 0826)   . heparin  5,000 Units Subcutaneous Q8H  . latanoprost  1 drop Right Eye QHS  . levothyroxine  50 mcg Oral Q0600   acetaminophen **OR** acetaminophen, ondansetron **OR** ondansetron (ZOFRAN) IV, polyethylene glycol  Assessment/ Plan:  Ms. Brandy Schroeder is a 84 y.o. white female with diastolic congestive heart failure, depression, hypothyroidism, arthritis, left eye blindness, glaucoma, who was admitted to [facility] on 06/10/2018  1. Acute Renal Failure on chronic kidney disease stage IV. Baseline creatinine of 1.55, GFR of 28 on 12/29/17.  Acute renal failure most likely from overdiuresis versus ATN.  Nonoliguric urine output.  No  change in creatinine Ultrasound reviewed with family at bedside.  - Monitor IV fluid administration closely due to diastolic congestive heart failure.  - Hold diuretics.   2. Anemia of chronic kidney disease: hemoglobin 10.6  3. Acute exacerbation of diastolic congestive heart failure - off diuretics   4. Urinary tract infection: urine culture positive on 11/17 with proteus mirabilis.  - empiric ceftriaxone   LOS: 2 Jaxxon Naeem 11/19/20191:13 PM

## 2018-06-12 NOTE — Clinical Social Work Note (Signed)
Clinical Social Work Assessment  Patient Details  Name: Brandy Schroeder MRN: [id number] Date of Birth: 12/06/1918  Date of referral:  06/12/18               Reason for consult:  Discharge Planning                Permission sought to share information with:   Permission granted to share information::    Name::        Agency::     Relationship::     Contact Information:     Housing/Transportation Living arrangements for the past 2 months:  Group Home Source of Information:  Patient Patient Interpreter Needed:  None Criminal Activity/Legal Involvement Pertinent to Current Situation/Hospitalization:  No - Comment as needed Significant Relationships:  Adult Children Lives with:  Facility Resident Do you feel safe going back to the place where you live?  Yes Need for family participation in patient care:  Yes (Comment)  Care giving concerns:  Patient [place] at [place] group home in [place]. The owner of [facility] is: Archie Pattenonya: [phone]: [phone] or [name]:: [phone] have stated patient would have to walk and currently she is unable.    Social Worker assessment / plan:  CSW spoke with patient's family who were at patient's bedside this morning. Patient son: Brandy PeonJerry Schroeder was in patient's room and he is the Power of [phone]. Archie Pattenonya and patient's son, Brandy SorrowJerry, have agreed that they wish to pursue short term rehab. [facility] [place].   CSW initiated bed search today and [facility] time for discharge.   Employment status:  Retired Health and safety inspectornsurance information:  Medicare PT Recommendations:  Skilled Nursing Facility Information / Referral to community resources:     Patient/Family's Response to care:  Patient's family expressed appreciation for CSW assistance.  Patient/Family's Understanding of and Emotional Response to Diagnosis, Current Treatment, and Prognosis:  Patient's son is hopeful that his mother can return to her baseline after  rehab.  Emotional Assessment Appearance:  Appears stated age Attitude/Demeanor/Rapport:    Affect (typically observed):  Calm Orientation:  Oriented to Self Alcohol / Substance use:  Not Applicable Psych involvement (Current and /or in the community):  No (Comment)  Discharge Needs  Concerns to be addressed:  Care Coordination Readmission within the last 30 days:  No Current discharge risk:  None Barriers to Discharge:  No Barriers Identified   York SpanielMonica Kaysie Michelini, LCSW 06/12/2018, 1:54 PM

## 2018-06-12 NOTE — Progress Notes (Addendum)
[facility] [place] [facility]   PATIENT NAME: Brandy RosenthalChristine Schroeder    MR#:  [id number]  DATE OF BIRTH:  12/29/1918  SUBJECTIVE:  CHIEF COMPLAINT:   Chief Complaint  Patient presents with  . Leg Swelling   -More alert today.  Blood pressure dropped later in the day yesterday that she received fluid boluses and IV albumin.  Blood pressure is improving today. -Hypothermic today  REVIEW OF SYSTEMS:  Review of Systems  Constitutional: Positive for fever and malaise/fatigue.       Hypothermia   HENT: Positive for hearing loss.   Eyes: Negative for blurred vision and double vision.  Respiratory: Negative for cough.   Gastrointestinal: Negative for abdominal pain, diarrhea, heartburn, nausea and vomiting.  Genitourinary: Negative for dysuria and urgency.  Musculoskeletal: Negative for myalgias.  Neurological: Negative for dizziness, sensory change, speech change, focal weakness and weakness.  Psychiatric/Behavioral: Negative for depression.    DRUG ALLERGIES:   Allergies  Allergen Reactions  . Sulfa Antibiotics     VITALS:  Blood pressure 112/76, pulse 76, temperature (!) 93.7 F (34.3 C), temperature source Rectal, resp. rate 20, height 5\' 6"  (1.676 m), weight 82.5 kg, SpO2 92 %.  PHYSICAL EXAMINATION:  Physical Exam  GENERAL:  84 y.o.-year-old elderly patient lying in the bed, ill-appearing.  Patient is very hard of hearing EYES: Left pupil is dilated and not reacting to light.  Right pupil is reacting to light.. No scleral icterus. Extraocular muscles intact.  HEENT: Head atraumatic, normocephalic. Oropharynx and nasopharynx clear.  NECK:  Supple, no jugular venous distention. No thyroid enlargement, no tenderness.  LUNGS: Normal breath sounds bilaterally, no wheezing, rales,rhonchi or crepitation.  Appears dyspneic.  No use of accessory muscles of respiration.  Decreased bibasilar breath sounds. CARDIOVASCULAR: S1, S2 normal. No  rubs, or gallops.   2/6 systolic murmur is present ABDOMEN: Soft, nontender, nondistended. Bowel sounds present. No organomegaly or mass.  EXTREMITIES: No  cyanosis, or clubbing.  2+ bilateral pedal edema noted NEUROLOGIC: Very hard of hearing.  Cranial nerves are intact.  Sensation is intact.  Motor strength is equal in all extremities and able to move all extremities and following simple commands today PSYCHIATRIC: The patient is more alert today, though lethargic and fatigued with stimulation. SKIN: No obvious rash, lesion, or ulcer   LABORATORY PANEL:   CBC Recent Labs  Lab 06/11/18 0507  WBC 4.8  HGB 10.6*  HCT 32.1*  PLT 115*   ------------------------------------------------------------------------------------------------------------------  Chemistries  Recent Labs  Lab 06/10/18 1750  06/12/18 0322  NA 141   < > 146*  K 4.6   < > 3.5  CL 103   < > 108  CO2 28   < > 26  GLUCOSE 128*   < > 79  BUN 68*   < > 68*  CREATININE 2.17*   < > 2.31*  CALCIUM 9.4   < > 8.5*  AST 77*  --   --   ALT 94*  --   --   ALKPHOS 71  --   --   BILITOT 0.7  --   --    < > = values in this interval not displayed.   ------------------------------------------------------------------------------------------------------------------  Cardiac Enzymes Recent Labs  Lab 06/10/18 1750  TROPONINI <0.03   ------------------------------------------------------------------------------------------------------------------  RADIOLOGY:  Dg Chest Port 1 View  Result Date: 06/10/2018 CLINICAL DATA:  Lethargy EXAM: PORTABLE CHEST 1 VIEW COMPARISON:  12/29/2017 FINDINGS: Possible small pleural effusions. Linear scarring at  the left base. Mild cardiomegaly with aortic atherosclerosis. Apical calcification and pleural thickening. No pneumothorax. IMPRESSION: 1. Suspected small pleural effusions with linear scarring or atelectasis at the left base. 2. Borderline to mild cardiomegaly Electronically Signed   By: Jasmine Pang  M.D.   On: 06/10/2018 18:30    EKG:   Orders placed or performed during the hospital encounter of 06/10/18  . ED EKG  . ED EKG  . EKG 12-Lead  . EKG 12-Lead    ASSESSMENT AND PLAN:   Brandy Schroeder  is a 84 y.o. 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  1.  Acute renal failure on CKD stage IV-increased BUN and creatinine, ATN and prerenal causes from increasing diuretics dose. -Baseline creatinine is 1.5 - Hold Lasix and metolazone -Renal ultrasound pending.  Nephrology consult appreciated -Due to hypotension, received fluid boluses yesterday and fluids had to be restarted.  Also received a dose of IV albumin.  2.    Sepsis-secondary to acute cystitis-could add to confusion and weakness. -Remains hypothermic today.  Continue bair  hugger -Blood cultures negative.  Urine cultures growing Proteus.  Currently on Rocephin.  Was taking Cipro as outpatient prior to admission  3.  Congestive heart failure- metolazone and Lasix are on hold.  f/u echocardiogram.   4.  Hypertension-on Norvasc- hold due to hypotension  5.  Hypothyroidism-elevated TSH but normal T4, on Synthroid  6.  DVT prophylaxis-subcutaneous heparin.  7.  Acute metabolic encephalopathy-secondary to sepsis, also renal failure.  Discontinued trazodone -slowly improving  Physical therapy consulted. Will need SNF at discharge if remains weak Family updated at bedside    All the records are reviewed and case discussed with Care Management/Social Workerr. Management plans discussed with the patient, family and they are in agreement.  CODE STATUS: Full code  TOTAL CRITICAL CARE TIME  SPENT IN TAKING CARE OF THIS PATIENT: 38 minutes.   POSSIBLE D/C IN 2-3 DAYS,  DEPENDING ON CLINICAL CONDITION.   Brandy Schroeder M.D on 06/12/2018 at 9:26 AM  Between 7am to 6pm - Pager - [phone]  After 6pm go to [email or web address] - Social research officer, government  [facility]  Office  [phone]  CC: Primary care physician; Chrismon, Jodell Cipro, PA

## 2018-06-12 NOTE — Consult Note (Signed)
Cardiology Consultation:   Patient ID: Brandy Schroeder MRN: [id number]; DOB: January 23, 1919  Admit date: 06/10/2018 Date of Consult: 06/12/2018  Primary Care Provider: Tamsen Roers, PA Primary Cardiologist: Tona Sensing, Dr. Okey Dupre Primary Electrophysiologist:  None    Patient Profile:   Brandy Schroeder is a 84 y.o. female with a hx of PCP diagnosed CHF, CKD IV (baseline Cr 1.5), hypothyroidism, arthritis, macular degeneration and L eye retinal detachment thus legal blindness who is being seen today for the evaluation of bradycardia at the request of Dr. Nemiah Commander.  History of Present Illness:   Brandy Schroeder is a 84 yo female with PMH as above and no regular cardiologist or previous cardiologist / workup for bradycardia or suspected CHF.   *CHF diagnosis / management is per PCP on review of patient's chart and further information regarding HF / LVEF to be determined and pending today's TTE.   *Given patient's current cystitis and sepsis, decreased renal function with metabolic encephalopathy / AMS, patient is a poor historian and below history obtained via multiple family members and on review of medical records / chart.   11/7-11/15: Patient diagnosed and treated for UTI 11/7 with abx. Per PCP visit and documentation on 11/15, she had completed abx treatment with no further UTI sx. Per documentation by PCP, patient only reported dysphagia, depression, and c/o constipation and AM cough at that appointment. Since her UTI, family noted episodes of AMS / hallucinations with weakness and tremor. Family noted decreased ability to ambulate without a wheelchair and weight increase, specifically a 2 lb wt gain within 1 day on 11/14 and 4 lb wt gain on 06/08/18 with usual dry weight 172-174 lbs. Per documentation, patient experienced progressive LEE that increased significantly on the evening of 11/14. Patient also reportedly had a nurse stop by and evaluate her with report of crackles in the RLL.    11/15: At that visit, PCP documentation reflected a suspicion for CHF exacerbation. PTA medications included Lasix 40mg  BID. Per PCP, metolazone Zaroxoly 2.5mg  added for 3 days with instruction that caregiver check and log BP and weight daily. Plan was to recheck CBC and BMP to assess renal function and r/o infection. PCP noted suspicion for CHF exacerbation in documentation. Also documented was patient's CKD IV with recnt UTI treated with abx. PCP documented patient had no further UTI sx and planned to recheck CBC with diff and BMET.   Patient present to [facility] ED 11/17 from assisted living with c/o continued LEE. Per facility, patient medication adjustment had only resulted in 3lb weight decrease and they were concerned about the patient and so desired further evaluation as patient seemed to have increasing DOE.   11/17 and In the ED, bibasilar crackles noted to be worse on the R with: Vitals: BP 107/51, HR 55, RR 16 T 88.35F, SpO2 94% Labs: glucose 128, Cr 2.17, BUN 68, AST 77, ALT 94, Hgb 11.9, plts 129 BNP 236 UA positive for leukocytes and rare bacteria EKG: 56bpm and without STE CXR: Small pleural effusions with linear scarring or atelectasis at L base. Borderline to mild cardiomegaly Meds: Bear hugger / warmer.   Since admission, improvement in BP and patient is reportedly more alert though still unable to provide history and somnolent on exam.  Past Medical History:  Diagnosis Date  . Anxiety   . Blind left eye   . CHF (congestive heart failure) (HCC)   . CKD (chronic kidney disease)   . Depression   . Hypothyroidism   .  OA (osteoarthritis)     Past Surgical History:  Procedure Laterality Date  . ABDOMINAL HYSTERECTOMY    . CHOLECYSTECTOMY    . EYE SURGERY       Home Medications:  Prior to Admission medications   Medication Sig Start Date End Date Taking? Authorizing Provider  acetaminophen (TYLENOL) 500 MG tablet Take 500 mg by mouth every 6 (six) hours as needed.     [provider]  Alpha-Lipoic Acid 300 MG CAPS TAKE (1) CAPSULE BY MOUTH ONCE DAILY. 01/22/18   Chrismon, Jodell Ciproennis E, PA  amLODipine (NORVASC) 10 MG tablet TAKE 1 TABLET BY MOUTH ONCE DAILY. 01/22/18   Chrismon, Jodell Ciproennis E, PA  ciprofloxacin (CIPRO) 500 MG tablet Take 1 tablet (500 mg total) by mouth 2 (two) times daily. Patient not taking: Reported on 06/08/2018 05/30/18   Malva LimesFisher, Donald E, MD  D3 SUPER STRENGTH 2000 units CAPS TAKE (1) CAPSULE BY MOUTH ONCE DAILY. 01/22/18   Chrismon, Jodell Ciproennis E, PA  dextromethorphan-guaiFENesin (MUCINEX DM) 30-600 MG 12hr tablet Take 1 tablet by mouth 2 (two) times daily. 12/29/17   Chrismon, Jodell Ciproennis E, PA  doxycycline (VIBRA-TABS) 100 MG tablet Take 1 tablet (100 mg total) by mouth 2 (two) times daily. 12/29/17   Chrismon, Jodell Ciproennis E, PA  furosemide (LASIX) 40 MG tablet TAKE 1 TABLET BY MOUTH TWICE DAILY (MORNING AND LUNCH) 06/12/18   Chrismon, Jodell Ciproennis E, PA  latanoprost (XALATAN) 0.005 % ophthalmic solution Place 1 drop into the right eye at bedtime.     [provider]  levothyroxine (SYNTHROID, LEVOTHROID) 50 MCG tablet TAKE 1 TABLET BY MOUTH ONCE DAILY BEFORE BREAKFAST. 12/25/17   Chrismon, Jodell Ciproennis E, PA  loperamide (IMODIUM A-D) 2 MG tablet Take 1 tablet (2 mg total) by mouth 3 (three) times daily as needed for diarrhea or loose stools. 11/24/17   Chrismon, Jodell Ciproennis E, PA  metolazone (ZAROXOLYN) 2.5 MG tablet Take 1 tablet (2.5 mg total) by mouth daily. Take as directed for 3 days then call report of progress. 06/08/18   Chrismon, Jodell Ciproennis E, PA  polyethylene glycol powder (GLYCOLAX/MIRALAX) powder Take 17 g by mouth 2 (two) times daily as needed. Mix powder in 8 ounces of Gatorade. 06/05/18   Chrismon, Jodell Ciproennis E, PA  traZODone (DESYREL) 150 MG tablet TAKE ONE TABLET BY MOUTH AT BEDTIME. 06/12/18   Chrismon, Jodell Ciproennis E, PA    Inpatient Medications: Scheduled Meds: . heparin  5,000 Units Subcutaneous Q8H  . hydrocortisone sod succinate (SOLU-CORTEF) inj  100 mg  Intravenous Q8H  . latanoprost  1 drop Right Eye QHS  . levothyroxine  50 mcg Oral Q0600   Continuous Infusions: . sodium chloride 60 mL/hr at 06/12/18 1508  . cefTRIAXone (ROCEPHIN)  IV Stopped (06/12/18 0857)   PRN Meds: acetaminophen **OR** acetaminophen, ondansetron **OR** ondansetron (ZOFRAN) IV, polyethylene glycol  Allergies:    Allergies  Allergen Reactions  . Sulfa Antibiotics     Social History:   Social History   Socioeconomic History  . Marital status: Married    Spouse name: Not on file  . Number of children: Not on file  . Years of education: Not on file  . Highest education level: Not on file  Occupational History  . Not on file  Social Needs  . Financial resource strain: Not on file  . Food insecurity:    Worry: Not on file    Inability: Not on file  . Transportation needs:    Medical: Not on file    Non-medical: Not on  file  Tobacco Use  . Smoking status: Never Smoker  . Smokeless tobacco: Never Used  Substance and Sexual Activity  . Alcohol use: No    Alcohol/week: 0.0 standard drinks  . Drug use: No  . Sexual activity: Not on file  Lifestyle  . Physical activity:    Days per week: Not on file    Minutes per session: Not on file  . Stress: Not on file  Relationships  . Social connections:    Talks on phone: Not on file    Gets together: Not on file    Attends religious service: Not on file    Active member of club or organization: Not on file    Attends meetings of clubs or organizations: Not on file    Relationship status: Not on file  . Intimate partner violence:    Fear of current or ex partner: Not on file    Emotionally abused: Not on file    Physically abused: Not on file    Forced sexual activity: Not on file  Other Topics Concern  . Not on file  Social History Narrative   Patient is from an assisted living facility.  Oriented at baseline and also ambulates with a walker.    Family History:    Family History  Problem  Relation Age of Onset  . Arthritis Mother   . Stroke Mother   . Heart disease Brother   . Arthritis Brother      ROS:  Please see the history of present illness.   All other ROS reviewed and negative.     Physical Exam/Data:   Vitals:   06/12/18 0034 06/12/18 0553 06/12/18 0635 06/12/18 1136  BP: (!) 101/50 112/76  (!) 121/45  Pulse: 82 76  (!) 52  Resp: (!) 22 20  15   Temp:   (!) 93.7 F (34.3 C) (!) 97.4 F (36.3 C)  TempSrc:   Rectal Oral  SpO2: 93% 92%  (!) 84%  Weight:      Height:        Intake/Output Summary (Last 24 hours) at 06/12/2018 1730 Last data filed at 06/12/2018 1650 Gross per 24 hour  Intake 3238.64 ml  Output 500 ml  Net 2738.64 ml   Filed Weights   06/10/18 1730 06/10/18 2057  Weight: 82.1 kg 82.5 kg   Body mass index is 29.36 kg/m.  General:  Frail, elderly woman - AMS, somnolence, work of breathing HEENT: on [place] O2, accessory muscle use with breathing Neck: no JVD noted but difficult to assess given work of breathing Vascular: Radial pulses intact and symmetric  Cardiac:  Bradycardic but regular rhythm; no murmur heard Lungs:  Expiratory wheezes and reduced breath sounds with clear work of breathing on O2 Abd: soft, no hepatomegaly  Ext: trace to no bilateral edema Musculoskeletal:  No deformities, bruising noted Skin: warm and dry  Neuro: AMS Psych:  AMS and not at baseline per family report   EKG:  The EKG was personally reviewed and demonstrates: no new tracings Telemetry:  Telemetry was personally reviewed and demonstrates:  Sinus bradycardia with sinus pauses up to 2.8 seconds  Relevant CV Studies: Pending TTE  Laboratory Data:  Chemistry Recent Labs  Lab 06/10/18 1750 06/11/18 0507 06/12/18 0322  NA 141 142 146*  K 4.6 4.0 3.5  CL 103 104 108  CO2 28 28 26   GLUCOSE 128* 85 79  BUN 68* 68* 68*  CREATININE 2.17* 2.35* 2.31*  CALCIUM 9.4 8.7* 8.5*  GFRNONAA 18* 16* 17*  GFRAA 21* 19* 19*  ANIONGAP 10 10 12       Recent Labs  Lab 06/10/18 1750  PROT 7.2  ALBUMIN 3.9  AST 77*  ALT 94*  ALKPHOS 71  BILITOT 0.7   Hematology Recent Labs  Lab 06/10/18 1750 06/11/18 0507 Jul 01, 2018 1331  WBC 5.6 4.8 4.5  RBC 3.93 3.50* 3.38*  HGB 11.9* 10.6* 10.2*  HCT 36.9 32.1* 32.1*  MCV 93.9 91.7 95.0  MCH 30.3 30.3 30.2  MCHC 32.2 33.0 31.8  RDW 15.8* 15.6* 15.9*  PLT 129* 115* 89*   Cardiac Enzymes Recent Labs  Lab 06/10/18 1750  TROPONINI <0.03   No results for input(s): TROPIPOC in the last 168 hours.  BNP Recent Labs  Lab 06/10/18 1750  BNP 236.0*    DDimer No results for input(s): DDIMER in the last 168 hours.  Radiology/Studies:  US Renal  Result Date: 07/01/2018 CLINICAL DATA:  Acute renal failure. EXAM: RENAL / URINARY TRACT ULTRASOUND COMPLETE COMPARISON:  None. FINDINGS: Right Kidney: Renal measurements: 7.9 x 3.6 x 4.3 cm = volume: 65 mL. Renal cortical thinning with borderline increased parenchymal echogenicity. No mass or hydronephrosis identified. Left Kidney: Renal measurements: 8.3 x 4.8 x 5.7 cm = volume: 118 mL. Mildly increased parenchymal echogenicity. No mass or hydronephrosis identified. Bladder: Not visualized. IMPRESSION: Evidence of medical renal disease without hydronephrosis. Electronically Signed   By: Sebastian Ache M.D.   On: 07/01/2018 10:08   Dg Chest Port 1 View  Result Date: 06/10/2018 CLINICAL DATA:  Lethargy EXAM: PORTABLE CHEST 1 VIEW COMPARISON:  12/29/2017 FINDINGS: Possible small pleural effusions. Linear scarring at the left base. Mild cardiomegaly with aortic atherosclerosis. Apical calcification and pleural thickening. No pneumothorax. IMPRESSION: 1. Suspected small pleural effusions with linear scarring or atelectasis at the left base. 2. Borderline to mild cardiomegaly Electronically Signed   By: Jasmine Pang M.D.   On: 06/10/2018 18:30    Assessment and Plan:   Bradycardia with sinus pauses  - No cardiologist of record.  Family reported heart  failure diagnosis but patient has not officially had workup for heart failure. PCP managent with recent addition of metolazone - Refer to HPI. Given age and HTN as well as DOE, it is reasonable to workup for HF / evaluate with TTE although patient does not appear terribly volume overloaded on exam. - Patient not a great historian -unclear if patient symptomatic with bradycardia and sinus pauses. Family reported confusion and weakness but in the setting of recent UTI/infection with poor renal function, which likely both contributed to confusion and weakness. Family reported finding patient on floor when ill with flu earlier in the year but did not see a fall / syncopal episode thus could not definitively state the reason the patient was found on the floor. - Continue to monitor on telemetry and monitor BP and O2 sats.  - Daily BMET to monitor renal function, electrolytes. - Avoid  blocker or other AV nodal blocking agents given bradycardic with sinus pauses up to ~2.8 seconds. Agree with continued deferment of diuretics given hypotension, bradycardia, and poor renal function, as well given still awaiting EF/ echo - Consider endocrinology consultation as chronically elevated TSH. Hypothyroidism could contribute to bradycardic rates, pauses  - Do not recommend PPM at this time or invasive procedures / ischemic evaluation given instability of patient in setting of acute infection, poor renal function, hypotension, bradycardia, sinus pauses, and AMS. Pending echo, further recommendations regarding medical management. Further evaluation  could be considered once stabilization of the patient's renal function and recovery of illness if needed. - Could consider future / outpatient evaluation by EP if needed   Acute on CKD IV in the setting of UTI, cystitis  Associated anemia of chronic disease - Recent UTI with progression to acute cystitis and associated confusion, weakness. Pending urine and blood cultures.  -  Increased BUN, Cr. ATN /prerenal etiology likely given likely poor oral intake with infection and diuresis increased - Recommend daily BMET to monitor renal function and electrolytes, daily CBC to monitor Hgb - Renally dose medications, caution with invasive procedures and contrast imaging / procedures to avoid further decline of renal function with contrast induced nephropathy - Per nephrology   HTN - Held diuretics, 2/2 HTN. Continue to hold - Per IM  Hypothyroidism - As above, consider endocrinology consultation / optimized treatment as chronically elevated TSH and untreated hypothyroidism could contribute to bradycardic rates and sinus pauses - Previous PCP appointment documented sx consistent with hypothyroidism - Per IM  AMS - Likely multifactorial as above given renal function, metabolic encephalopathy with sepsis, bradycardic rates and sinus pauses - Monitor, per IM    For questions or updates, please contact [facility] Please consult [email or web address] for contact info under     Signed, Lennon Alstrom, PA-C  06/12/2018 5:30 PM

## 2018-06-12 NOTE — Progress Notes (Signed)
Bair hugger placed on pt. Rectal temp 93.7

## 2018-06-12 NOTE — [place] FL2 (Signed)
[place] MEDICAID FL2 LEVEL OF CARE SCREENING TOOL     IDENTIFICATION  Patient Name: Brandy Schroeder Birthdate: [birth year] Sex: female Admission Date (Current Location): 06/10/2018  [place] and [place] Number:  Chiropodist and Address:  [facility], [address] [place], [place] [address]      Provider Number: [id number]  Attending Physician Name and Address:  Enid Baas, MD  Relative Name and Phone Number:       Current Level of Care: Hospital Recommended Level of Care: Skilled Nursing Facility Prior Approval Number:    Date Approved/Denied:   PASRR Number:    Discharge Plan: SNF    Current Diagnoses: Patient Active Problem List   Diagnosis Date Noted  . Sepsis (HCC) 06/10/2018  . Multiple falls 09/05/2015  . Paroxysmal A-fib (HCC) 09/05/2015  . Acute on chronic renal failure (HCC) 09/05/2015  . Rhabdomyolysis 09/05/2015  . Anxiety 11/19/2014  . 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 Black stool 11/19/2014  . Degeneration of lumbar or lumbosacral intervertebral disc 11/19/2014  . Clinical depression 11/19/2014  . H/O: osteoarthritis 11/19/2014  . Adult hypothyroidism 11/19/2014  . Left leg pain 11/19/2014  . Primary localized osteoarthrosis, lower leg 11/19/2014  . Edema, peripheral 11/19/2014  . CKD (chronic kidney disease), stage IV (HCC) 11/19/2014  . Acquired spondylolisthesis 11/19/2014  . Dermatitis, stasis 11/19/2014    Orientation RESPIRATION BLADDER Height & Weight     Self, Place, Situation  Normal Incontinent Weight: 181 lb 14.1 oz (82.5 kg) Height:  5\' 6"  (167.6 cm)  BEHAVIORAL SYMPTOMS/MOOD NEUROLOGICAL BOWEL NUTRITION STATUS  (none) (none) Incontinent Diet  AMBULATORY STATUS COMMUNICATION OF NEEDS Skin   Extensive Assist Verbally Normal                       Personal Care Assistance Level of Assistance  Bathing, Feeding, Dressing Bathing Assistance: Limited assistance Feeding assistance:  Limited assistance Dressing Assistance: Limited assistance     Functional Limitations Info  Sight Sight Info: Impaired(wears glasses)        SPECIAL CARE FACTORS FREQUENCY  PT (By licensed PT)                    Contractures Contractures Info: Not present    Additional Factors Info  Code Status Code Status Info: dnr             Current Medications (06/12/2018):  This is the current hospital active medication list Current Facility-Administered Medications  Medication Dose Route Frequency Provider Last Rate Last Dose  . 0.9 %  sodium chloride infusion   Intravenous Continuous Enid Baas, MD 60 mL/hr at 06/12/18 0400    . acetaminophen (TYLENOL) tablet 650 mg  650 mg Oral Q6H PRN Enid Baas, MD       Or  . acetaminophen (TYLENOL) suppository 650 mg  650 mg Rectal Q6H PRN Enid Baas, MD      . cefTRIAXone (ROCEPHIN) 1 g in sodium chloride 0.9 % 100 mL IVPB  1 g Intravenous Q24H Enid Baas, MD 200 mL/hr at 06/12/18 0826 1 g at 06/12/18 0826  . heparin injection 5,000 Units  5,000 Units Subcutaneous Q8H Enid Baas, MD   5,000 Units at 06/12/18 0604  . latanoprost (XALATAN) 0.005 % ophthalmic solution 1 drop  1 drop Right Eye Francee Gentile, MD   1 drop at 06/11/18 2154  . levothyroxine (SYNTHROID, LEVOTHROID) tablet 50 mcg  50 mcg Oral Q0600 Enid Baas, MD  50 mcg at 06/12/18 0604  . ondansetron (ZOFRAN) tablet 4 mg  4 mg Oral Q6H PRN Enid BaasKalisetti, Radhika, MD       Or  . ondansetron (ZOFRAN) injection 4 mg  4 mg Intravenous Q6H PRN Enid BaasKalisetti, Radhika, MD      . polyethylene glycol (MIRALAX / GLYCOLAX) packet 17 g  17 g Oral BID PRN Gardner CandleHallaji, Sheema M, [facility]         Discharge Medications: Please see discharge summary for a list of discharge medications.  Relevant Imaging Results:  Relevant Lab Results:   Additional Information ss: [id number]  York SpanielMonica Phill Steck, LCSW

## 2018-06-13 LAB — CBC WITH DIFFERENTIAL/PLATELET
ABS IMMATURE GRANULOCYTES: 0.04 10*3/uL (ref 0.00–0.07)
BASOS PCT: 0 %
Basophils Absolute: 0 10*3/uL (ref 0.0–0.1)
Eosinophils Absolute: 0 10*3/uL (ref 0.0–0.5)
Eosinophils Relative: 0 %
HCT: 32.7 % — ABNORMAL LOW (ref 36.0–46.0)
Hemoglobin: 10.6 g/dL — ABNORMAL LOW (ref 12.0–15.0)
Immature Granulocytes: 1 %
Lymphocytes Relative: 13 %
Lymphs Abs: 0.5 10*3/uL — ABNORMAL LOW (ref 0.7–4.0)
MCH: 30.5 pg (ref 26.0–34.0)
MCHC: 32.4 g/dL (ref 30.0–36.0)
MCV: 94 fL (ref 80.0–100.0)
MONO ABS: 0.1 10*3/uL (ref 0.1–1.0)
MONOS PCT: 2 %
NEUTROS ABS: 3.2 10*3/uL (ref 1.7–7.7)
Neutrophils Relative %: 84 %
PLATELETS: 99 10*3/uL — AB (ref 150–400)
RBC: 3.48 MIL/uL — ABNORMAL LOW (ref 3.87–5.11)
RDW: 15.9 % — ABNORMAL HIGH (ref 11.5–15.5)
WBC: 3.8 10*3/uL — ABNORMAL LOW (ref 4.0–10.5)
nRBC: 0.5 % — ABNORMAL HIGH (ref 0.0–0.2)

## 2018-06-13 LAB — BASIC METABOLIC PANEL
Anion gap: 17 — ABNORMAL HIGH (ref 5–15)
BUN: 83 mg/dL — AB (ref 8–23)
CALCIUM: 8.5 mg/dL — AB (ref 8.9–10.3)
CO2: 20 mmol/L — ABNORMAL LOW (ref 22–32)
CREATININE: 2.41 mg/dL — AB (ref 0.44–1.00)
Chloride: 111 mmol/L (ref 98–111)
GFR calc Af Amer: 18 mL/min — ABNORMAL LOW (ref >60)
GFR, EST NON AFRICAN AMERICAN: 16 mL/min — AB (ref >60)
GLUCOSE: 104 mg/dL — AB (ref 70–99)
Potassium: 3.4 mmol/L — ABNORMAL LOW (ref 3.5–5.1)
Sodium: 148 mmol/L — ABNORMAL HIGH (ref 135–145)

## 2018-06-13 LAB — URINE CULTURE

## 2018-06-13 MED ORDER — SODIUM CHLORIDE 0.45 % IV SOLN
INTRAVENOUS | Status: DC
  Administered 2018-06-13: 10:00:00 via INTRAVENOUS

## 2018-06-13 MED ORDER — CEPHALEXIN 250 MG PO CAPS
250.0000 mg | ORAL_CAPSULE | Freq: Three times a day (TID) | ORAL | Status: DC
  Administered 2018-06-14 – 2018-06-15 (×4): 250 mg via ORAL
  Filled 2018-06-13 (×6): qty 1

## 2018-06-13 MED ORDER — HYDROCORTISONE NA SUCCINATE PF 100 MG IJ SOLR
50.0000 mg | Freq: Two times a day (BID) | INTRAMUSCULAR | Status: DC
  Administered 2018-06-13 – 2018-06-14 (×2): 50 mg via INTRAVENOUS
  Filled 2018-06-13 (×2): qty 2

## 2018-06-13 NOTE — Progress Notes (Signed)
[facility]  ROUNDING NOTE   Subjective:   Family at bedside.   Patient more awake but hard of hearing and seems to not have insight.   Anuric for last shift.  BUN elevated - started on steroids   Objective:  Vital signs in last 24 hours:  Temp:  [94 F (34.4 C)-97.8 F (36.6 C)] 97.4 F (36.3 C) (11/20 1330) Pulse Rate:  [77-82] 79 (11/20 1330) Resp:  [16-20] 16 (11/20 1330) BP: (117-126)/(51-69) 126/58 (11/20 1330) SpO2:  [90 %-92 %] 92 % (11/20 1330)  Weight change:  Filed Weights   06/10/18 1730 06/10/18 2057  Weight: 82.1 kg 82.5 kg    Intake/Output: I/O last 3 completed shifts: In: 2523.9 [P.O.:300; I.V.:1678.9; IV Piggyback:545] Out: 500 [Urine:500]   Intake/Output this shift:  Total I/O In: 120 [P.O.:120] Out: 142 [Urine:141; Stool:1]  Physical Exam: General: NAD,   Head: +hard of hearing. Moist oral mucosal membranes  Eyes: Anicteric, PERRL  Neck: Supple, trachea midline  Lungs:  Wheezing bilaterally  Heart: Regular rate and rhythm  Abdomen:  Soft, nontender,   Extremities: No peripheral edema.  Neurologic: Alert to self and place  Skin: No lesions        Basic Metabolic Panel: Recent Labs  Lab 06/10/18 1750 06/11/18 0507 06/12/18 0322 06/13/18 0421  NA 141 142 146* 148*  K 4.6 4.0 3.5 3.4*  CL 103 104 108 111  CO2 28 28 26  20*  GLUCOSE 128* 85 79 104*  BUN 68* 68* 68* 83*  CREATININE 2.17* 2.35* 2.31* 2.41*  CALCIUM 9.4 8.7* 8.5* 8.5*    Liver Function Tests: Recent Labs  Lab 06/10/18 1750  AST 77*  ALT 94*  ALKPHOS 71  BILITOT 0.7  PROT 7.2  ALBUMIN 3.9   No results for input(s): LIPASE, AMYLASE in the last 168 hours. No results for input(s): AMMONIA in the last 168 hours.  CBC: Recent Labs  Lab 06/10/18 1750 06/11/18 0507 06/12/18 1331 06/13/18 0421  WBC 5.6 4.8 4.5 3.8*  NEUTROABS 4.7  --   --  3.2  HGB 11.9* 10.6* 10.2* 10.6*  HCT 36.9 32.1* 32.1* 32.7*  MCV 93.9 91.7 95.0 94.0  PLT 129* 115*  89* 99*    Cardiac Enzymes: Recent Labs  Lab 06/10/18 1750  TROPONINI <0.03    BNP: Invalid input(s): POCBNP  CBG: No results for input(s): GLUCAP in the last 168 hours.  Microbiology: Results for orders placed or performed during the hospital encounter of 06/10/18  Urine culture     Status: Abnormal   Collection Time: 06/10/18  5:50 PM  Result Value Ref Range Status   Specimen Description   Final    URINE, RANDOM Performed at [facility], [address] [place], [place] [address]    Special Requests   Final    NONE Performed at [facility], [address] [place], [place] [address]    Culture >=100,000 COLONIES/mL PROTEUS MIRABILIS (A)  Final   Report Status 06/13/2018 FINAL  Final   Organism ID, Bacteria PROTEUS MIRABILIS (A)  Final      Susceptibility   Proteus mirabilis - MIC*    AMPICILLIN <=2 SENSITIVE Sensitive     CEFAZOLIN <=4 SENSITIVE Sensitive     CEFTRIAXONE <=1 SENSITIVE Sensitive     CIPROFLOXACIN >=4 RESISTANT Resistant     GENTAMICIN <=1 SENSITIVE Sensitive     IMIPENEM 4 SENSITIVE Sensitive     NITROFURANTOIN 128 RESISTANT Resistant     TRIMETH/SULFA >=  320 RESISTANT Resistant     AMPICILLIN/SULBACTAM <=2 SENSITIVE Sensitive     PIP/TAZO <=4 SENSITIVE Sensitive     * >=100,000 COLONIES/mL PROTEUS MIRABILIS  Blood Culture (routine x 2)     Status: None (Preliminary result)   Collection Time: 06/10/18  5:52 PM  Result Value Ref Range Status   Specimen Description BLOOD RFA  Final   Special Requests   Final    BOTTLES DRAWN AEROBIC AND ANAEROBIC Blood Culture adequate volume   Culture   Final    NO GROWTH 3 DAYS Performed at [facility], [address] [place], [place] [address]    Report Status PENDING  Incomplete  Blood Culture (routine x 2)     Status: None (Preliminary result)   Collection Time: 06/10/18  5:52 PM  Result Value Ref Range Status   Specimen Description BLOOD RAC  Final   Special Requests    Final    BOTTLES DRAWN AEROBIC AND ANAEROBIC Blood Culture adequate volume   Culture   Final    NO GROWTH 3 DAYS Performed at [facility], [address] [place], [place] [address]    Report Status PENDING  Incomplete    Coagulation Studies: Recent Labs    06/10/18 1750  LABPROT 12.9  INR 0.98    Urinalysis: Recent Labs    06/10/18 1750  COLORURINE YELLOW*  LABSPEC 1.009  PHURINE 5.0  GLUCOSEU NEGATIVE  HGBUR NEGATIVE  BILIRUBINUR NEGATIVE  KETONESUR NEGATIVE  PROTEINUR NEGATIVE  NITRITE NEGATIVE  LEUKOCYTESUR LARGE*      Imaging: US Renal  Result Date: 06/12/2018 CLINICAL DATA:  Acute renal failure. EXAM: RENAL / URINARY TRACT ULTRASOUND COMPLETE COMPARISON:  None. FINDINGS: Right Kidney: Renal measurements: 7.9 x 3.6 x 4.3 cm = volume: 65 mL. Renal cortical thinning with borderline increased parenchymal echogenicity. No mass or hydronephrosis identified. Left Kidney: Renal measurements: 8.3 x 4.8 x 5.7 cm = volume: 118 mL. Mildly increased parenchymal echogenicity. No mass or hydronephrosis identified. Bladder: Not visualized. IMPRESSION: Evidence of medical renal disease without hydronephrosis. Electronically Signed   By: Sebastian Ache M.D.   On: 06/12/2018 10:08     Medications:    . [START ON 06/14/2018] cephALEXin  250 mg Oral Q8H  . heparin  5,000 Units Subcutaneous Q8H  . hydrocortisone sod succinate (SOLU-CORTEF) inj  50 mg Intravenous Q12H  . latanoprost  1 drop Right Eye QHS  . levothyroxine  50 mcg Oral Q0600   acetaminophen **OR** acetaminophen, ondansetron **OR** ondansetron (ZOFRAN) IV, polyethylene glycol  Assessment/ Plan:  Ms. Brandy Schroeder is a 84 y.o. white female with diastolic congestive heart failure, depression, hypothyroidism, arthritis, left eye blindness, glaucoma, who was admitted to [facility] on 06/10/2018  1. Acute Renal Failure on chronic kidney disease stage IV. Baseline creatinine of 1.55, GFR of 28 on 12/29/17.   Acute renal failure most likely from overdiuresis leading to ATN.  Anuric urine output.  No change in creatinine however elevated BUN.  - hold IV fluids - Hold diuretics.  - no indication for dialysis at this time. Family states they would consider dialysis only if necessary.   2. Anemia of chronic kidney disease: hemoglobin 10.6  3. Acute exacerbation of diastolic congestive heart failure - off diuretics   4. Urinary tract infection: urine culture positive on 11/17 with proteus mirabilis.  - ceftriaxone   LOS: 3 Zilla Shartzer 11/20/20193:40 PM

## 2018-06-13 NOTE — Progress Notes (Signed)
Patient's temperature was 94 degrees farenheit this morning, patient was covered in heated blankets to help warm her. Most recent temp was 97.4 farenheit.

## 2018-06-13 NOTE — Progress Notes (Signed)
[facility] [place] [facility]   PATIENT NAME: Brandy Schroeder    MR#:  [id number]  DATE OF BIRTH:  Jul 25, 1919  SUBJECTIVE:  CHIEF COMPLAINT:   Chief Complaint  Patient presents with  . Leg Swelling   -More alert today.  Ate her breakfast this morning.  Family at bedside -Urine output significantly decreased and renal function is worsening. -Blood pressure is better today  REVIEW OF SYSTEMS:  Review of Systems  Constitutional: Positive for malaise/fatigue. Negative for fever.       Hypothermia   HENT: Positive for hearing loss.   Eyes: Negative for blurred vision and double vision.  Respiratory: Negative for cough.   Gastrointestinal: Negative for abdominal pain, diarrhea, heartburn, nausea and vomiting.  Genitourinary: Negative for dysuria and urgency.  Musculoskeletal: Negative for myalgias.  Neurological: Negative for dizziness, sensory change, speech change, focal weakness and weakness.  Psychiatric/Behavioral: Negative for depression.    DRUG ALLERGIES:   Allergies  Allergen Reactions  . Sulfa Antibiotics     VITALS:  Blood pressure (!) 117/51, pulse 77, temperature (!) 94 F (34.4 C), temperature source Oral, resp. rate 18, height 5\' 6"  (1.676 m), weight 82.5 kg, SpO2 92 %.  PHYSICAL EXAMINATION:  Physical Exam  GENERAL:  84 y.o.-year-old elderly patient lying in the bed, ill-appearing.  Patient is very hard of hearing EYES: Left pupil is dilated and not reacting to light.  Right pupil is reacting to light.. No scleral icterus. Extraocular muscles intact.  HEENT: Head atraumatic, normocephalic. Oropharynx and nasopharynx clear.  NECK:  Supple, no jugular venous distention. No thyroid enlargement, no tenderness.  LUNGS: Normal breath sounds bilaterally, no wheezing, rales,rhonchi or crepitation.  Appears dyspneic.  No use of accessory muscles of respiration.  Decreased bibasilar breath sounds. CARDIOVASCULAR: S1, S2 normal. No  rubs, or  gallops.  2/6 systolic murmur is present ABDOMEN: Soft, nontender, nondistended. Bowel sounds present. No organomegaly or mass.  EXTREMITIES: No  cyanosis, or clubbing.  1+ bilateral pedal edema noted NEUROLOGIC: Very hard of hearing.  Cranial nerves are intact.  Sensation is intact.  Motor strength is equal in all extremities and able to move all extremities and following simple commands PSYCHIATRIC: The patient is more alert today, following commands and seems to be oriented x1-2  sKIN: No obvious rash, lesion, or ulcer   LABORATORY PANEL:   CBC Recent Labs  Lab 06/13/18 0421  WBC 3.8*  HGB 10.6*  HCT 32.7*  PLT 99*   ------------------------------------------------------------------------------------------------------------------  Chemistries  Recent Labs  Lab 06/10/18 1750  06/13/18 0421  NA 141   < > 148*  K 4.6   < > 3.4*  CL 103   < > 111  CO2 28   < > 20*  GLUCOSE 128*   < > 104*  BUN 68*   < > 83*  CREATININE 2.17*   < > 2.41*  CALCIUM 9.4   < > 8.5*  AST 77*  --   --   ALT 94*  --   --   ALKPHOS 71  --   --   BILITOT 0.7  --   --    < > = values in this interval not displayed.   ------------------------------------------------------------------------------------------------------------------  Cardiac Enzymes Recent Labs  Lab 06/10/18 1750  TROPONINI <0.03   ------------------------------------------------------------------------------------------------------------------  RADIOLOGY:  US Renal  Result Date: 06/12/2018 CLINICAL DATA:  Acute renal failure. EXAM: RENAL / URINARY TRACT ULTRASOUND COMPLETE COMPARISON:  None. FINDINGS: Right Kidney: Renal  measurements: 7.9 x 3.6 x 4.3 cm = volume: 65 mL. Renal cortical thinning with borderline increased parenchymal echogenicity. No mass or hydronephrosis identified. Left Kidney: Renal measurements: 8.3 x 4.8 x 5.7 cm = volume: 118 mL. Mildly increased parenchymal echogenicity. No mass or hydronephrosis  identified. Bladder: Not visualized. IMPRESSION: Evidence of medical renal disease without hydronephrosis. Electronically Signed   By: Sebastian AcheAllen  Grady M.D.   On: 06/12/2018 10:08    EKG:   Orders placed or performed during the hospital encounter of 06/10/18  . ED EKG  . ED EKG  . EKG 12-Lead  . EKG 12-Lead    ASSESSMENT AND PLAN:   Brandy Schroeder  is a 84 y.o. 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  1.  Acute renal failure on CKD stage IV-creatinine seems to be worsening today. -Also urine output is decreased.  Received several fluid boluses and IV fluids yesterday. -BUN is disproportionately elevated today, could be from the steroids IV -Appreciate nephrology consult.  Renal ultrasound with medical renal disease.  We will get a bladder scan today. -Baseline creatinine is 1.5 - Hold Lasix and metolazone -If urine output does not improve, discussed with family about need for dialysis.  They are unsure at this time  2.    Sepsis-secondary to acute cystitis-  Remains hypothermic.  But mental status is improving. -Blood cultures negative.  Urine cultures growing Proteus.  Currently on Rocephin.  Was taking Cipro as outpatient prior to admission-Proteus was resistant to Cipro.  Change to oral Keflex tomorrow -Stress dose steroids started for hypotension.  Improved blood pressure.  Taper off by tomorrow -Receiving IV fluids  3.  Congestive heart failure- metolazone and Lasix are on hold.  Echocardiogram with normal EF. -Patient did have some sinus bradycardia episodes with sinus pauses-appreciate cardiology input.  Likely secondary to vagal stimulation.  4.  Hypertension-on Norvasc- held due to hypotension  5.  Hypothyroidism-elevated  TSH but normal T4, on Synthroid  6.  DVT prophylaxis-subcutaneous heparin.  7.  Acute metabolic encephalopathy-secondary to sepsis, also renal failure.  Discontinued trazodone -slowly improving  Physical therapy consulted. Will need SNF at discharge if remains weak Family updated at bedside Anticipate discharge in the next 1 to 2 days if renal function improves    All the records are reviewed and case discussed with Care Management/Social Workerr. Management plans discussed with the patient, family and they are in agreement.  CODE STATUS: Full code  TOTAL  TIME  SPENT IN TAKING CARE OF THIS PATIENT: 41 minutes.   POSSIBLE D/C IN 2 DAYS, DEPENDING ON CLINICAL CONDITION.   Enid BaasKALISETTI,Smriti Barkow M.D on 06/13/2018 at 11:12 AM  Between 7am to 6pm - Pager - [phone]  After 6pm go to [email or web address] - Social research officer, governmentpassword EPAS [email or web address]  [facility]  Office  [phone]  CC: Primary care physician; Chrismon, Jodell Ciproennis E, PA

## 2018-06-13 NOTE — Care Management Important Message (Signed)
Copy of signed IM left with patient in room.  

## 2018-06-14 DIAGNOSIS — N179 Acute kidney failure, unspecified: Secondary | ICD-10-CM

## 2018-06-14 DIAGNOSIS — Z515 Encounter for palliative care: Secondary | ICD-10-CM

## 2018-06-14 LAB — RENAL FUNCTION PANEL
Albumin: 4 g/dL (ref 3.5–5.0)
Anion gap: 14 (ref 5–15)
BUN: 90 mg/dL — ABNORMAL HIGH (ref 8–23)
CALCIUM: 9.1 mg/dL (ref 8.9–10.3)
CHLORIDE: 110 mmol/L (ref 98–111)
CO2: 24 mmol/L (ref 22–32)
CREATININE: 2.34 mg/dL — AB (ref 0.44–1.00)
GFR calc Af Amer: 19 mL/min — ABNORMAL LOW (ref >60)
GFR calc non Af Amer: 16 mL/min — ABNORMAL LOW (ref >60)
GLUCOSE: 141 mg/dL — AB (ref 70–99)
Phosphorus: 5.2 mg/dL — ABNORMAL HIGH (ref 2.5–4.6)
Potassium: 3 mmol/L — ABNORMAL LOW (ref 3.5–5.1)
SODIUM: 148 mmol/L — AB (ref 135–145)

## 2018-06-14 MED ORDER — POTASSIUM CHLORIDE 10 MEQ/100ML IV SOLN
10.0000 meq | INTRAVENOUS | Status: AC
  Administered 2018-06-14 (×4): 10 meq via INTRAVENOUS
  Filled 2018-06-14 (×4): qty 100

## 2018-06-14 MED ORDER — IBUPROFEN 100 MG/5ML PO SUSP
200.0000 mg | Freq: Four times a day (QID) | ORAL | Status: DC | PRN
  Administered 2018-06-14: 200 mg via ORAL
  Filled 2018-06-14 (×3): qty 10

## 2018-06-14 MED ORDER — SODIUM CHLORIDE 0.9 % IV SOLN
INTRAVENOUS | Status: DC
  Administered 2018-06-14 – 2018-06-15 (×2): via INTRAVENOUS

## 2018-06-14 NOTE — Progress Notes (Signed)
PT Cancellation Note  Patient Details Name: Brandy Schroeder MRN: [id number] DOB: 11/29/1918   Cancelled Treatment:    Reason Eval/Treat Not Completed: Medical issues which prohibited therapy.  Pt with bradycardia and sinus pauses with potassium level lower at 3.0.  Per chart review, pt lethargic this date.  Will hold PT until pt more medically appropriate for exertional activity.    Encarnacion ChuAshley Abashian PT, DPT 06/14/2018, 11:21 AM

## 2018-06-14 NOTE — Consult Note (Signed)
[name]  [phone] [phone]   Name: Brandy Schroeder Date: 06/14/2018 MRN: [id number]  DOB: [birth year]  Patient Care Team: Margo Common, PA as PCP - General (Physician Assistant)    REASON FOR CONSULTATION: Palliative Care consult requested for this 84 y.o. 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  Patient was recently treated outpatient for UTI and for increased weight gain and edema.  Here she was found to have UTI and acute on chronic renal failure.  Palliative care was consulted to help address goals.   SOCIAL HISTORY:    Patient lives in an assisted living facility in [place].  She has a son and 2 daughters.  Patient is widowed.  ADVANCE DIRECTIVES:  Patient's son-in-law is her financial POA.  Patient does not have a healthcare power of attorney.  CODE STATUS: DNR  PAST MEDICAL HISTORY: Past Medical History:  Diagnosis Date  . Anxiety   . Blind left eye   . CHF (congestive heart failure) ([place])   . CKD (chronic kidney disease)   . Depression   . Hypothyroidism   . OA (osteoarthritis)     PAST SURGICAL HISTORY:  Past Surgical History:  Procedure Laterality Date  . ABDOMINAL HYSTERECTOMY    . CHOLECYSTECTOMY    . EYE SURGERY      HEMATOLOGY/ONCOLOGY HISTORY:   No history exists.    ALLERGIES:  is allergic to sulfa antibiotics.  MEDICATIONS:  Current Facility-Administered Medications  Medication Dose Route Frequency Provider Last Rate Last Dose  . 0.9 %  sodium chloride infusion   Intravenous Continuous Kolluru, Sarath, MD 50 mL/hr at 06/14/18 1411    . acetaminophen (TYLENOL) tablet 650 mg  650 mg Oral Q6H PRN Gladstone Lighter, MD   650 mg at 06/14/18 0947   Or  . acetaminophen (TYLENOL) suppository 650 mg  650 mg Rectal Q6H PRN Gladstone Lighter, MD      . cephALEXin (KEFLEX) capsule 250 mg  250 mg Oral Q8H Gladstone Lighter, MD   250 mg at 06/14/18 1512  . heparin injection 5,000 Units  5,000 Units Subcutaneous Q8H Gladstone Lighter, MD   5,000 Units at 06/14/18 1411  . latanoprost (XALATAN) 0.005 % ophthalmic solution 1 drop  1 drop Right Eye Halford Chessman, MD   1 drop at 06/13/18 2321  . levothyroxine (SYNTHROID, LEVOTHROID) tablet 50 mcg  50 mcg Oral Q0600 Gladstone Lighter, MD   50 mcg at 06/14/18 0606  . ondansetron (ZOFRAN) tablet 4 mg  4 mg Oral Q6H PRN Gladstone Lighter, MD       Or  . ondansetron (ZOFRAN) injection 4 mg  4 mg Intravenous Q6H PRN Gladstone Lighter, MD      . polyethylene glycol (MIRALAX / GLYCOLAX) packet 17 g  17 g Oral BID PRN Hallaji, Sheema M, RPH        VITAL SIGNS: BP 130/60   Pulse 71   Temp (!) 94 F (34.4 C) (Rectal)   Resp 16   Ht '5\' 6"'  (1.676 m)   Wt 181 lb 14.1 oz (82.5 kg)   SpO2 93%   BMI 29.36 kg/m  Filed Weights   06/10/18 1730 06/10/18 2057  Weight: 181 lb (82.1 kg) 181 lb 14.1 oz (82.5 kg)    Estimated  body mass index is 29.36 kg/m as calculated from the following:   Height as of this encounter: '5\' 6"'  (1.676 m).   Weight as of this encounter: 181 lb 14.1 oz (82.5 kg).  LABS: CBC:    Component Value Date/Time   WBC 3.8 (L) 06/13/2018 0421   HGB 10.6 (L) 06/13/2018 0421   HGB 12.5 12/29/2017 0913   HCT 32.7 (L) 06/13/2018 0421   HCT 37.5 12/29/2017 0913   PLT 99 (L) 06/13/2018 0421   PLT 295 12/29/2017 0913   MCV 94.0 06/13/2018 0421   MCV 90 12/29/2017 0913   NEUTROABS 3.2 06/13/2018 0421   NEUTROABS 8.3 (H) 12/29/2017 0913   LYMPHSABS 0.5 (L) 06/13/2018 0421   LYMPHSABS 1.3 12/29/2017 0913   MONOABS 0.1 06/13/2018 0421   EOSABS 0.0 06/13/2018 0421   EOSABS 0.1 12/29/2017 0913   BASOSABS 0.0 06/13/2018 0421   BASOSABS 0.0 12/29/2017 0913   Comprehensive  Metabolic Panel:    Component Value Date/Time   NA 148 (H) 06/14/2018 0403   NA 141 12/29/2017 0913   K 3.0 (L) 06/14/2018 0403   CL 110 06/14/2018 0403   CO2 24 06/14/2018 0403   BUN 90 (H) 06/14/2018 0403   BUN 25 12/29/2017 0913   CREATININE 2.34 (H) 06/14/2018 0403   CREATININE 1.60 (H) 05/22/2017 1136   GLUCOSE 141 (H) 06/14/2018 0403   CALCIUM 9.1 06/14/2018 0403   AST 77 (H) 06/10/2018 1750   ALT 94 (H) 06/10/2018 1750   ALKPHOS 71 06/10/2018 1750   BILITOT 0.7 06/10/2018 1750   BILITOT 0.3 12/29/2017 0913   PROT 7.2 06/10/2018 1750   PROT 6.8 12/29/2017 0913   ALBUMIN 4.0 06/14/2018 0403   ALBUMIN 4.0 12/29/2017 0913    RADIOGRAPHIC STUDIES: US Renal  Result Date: 06/12/2018 CLINICAL DATA:  Acute renal failure. EXAM: RENAL / URINARY TRACT ULTRASOUND COMPLETE COMPARISON:  None. FINDINGS: Right Kidney: Renal measurements: 7.9 x 3.6 x 4.3 cm = volume: 65 mL. Renal cortical thinning with borderline increased parenchymal echogenicity. No mass or hydronephrosis identified. Left Kidney: Renal measurements: 8.3 x 4.8 x 5.7 cm = volume: 118 mL. Mildly increased parenchymal echogenicity. No mass or hydronephrosis identified. Bladder: Not visualized. IMPRESSION: Evidence of medical renal disease without hydronephrosis. Electronically Signed   By: Logan Bores M.D.   On: 06/12/2018 10:08   Dg Chest Port 1 View  Result Date: 06/10/2018 CLINICAL DATA:  Lethargy EXAM: PORTABLE CHEST 1 VIEW COMPARISON:  12/29/2017 FINDINGS: Possible small pleural effusions. Linear scarring at the left base. Mild cardiomegaly with aortic atherosclerosis. Apical calcification and pleural thickening. No pneumothorax. IMPRESSION: 1. Suspected small pleural effusions with linear scarring or atelectasis at the left base. 2. Borderline to mild cardiomegaly Electronically Signed   By: Donavan Foil M.D.   On: 06/10/2018 18:30    PERFORMANCE STATUS (ECOG) : 4 - Bedbound  Review of Systems As noted above.  Otherwise, a complete review of systems is negative.  Physical Exam General: Frail-appearing Cardiovascular: regular rate and rhythm Pulmonary: clear ant fields Abdomen: soft, nontender, + bowel sounds Extremities: Bilateral lower extremity edema Skin: no rashes Neurological: Weakness, confusion  IMPRESSION: I met with patient's son, daughter-in-law, and grandson.  Patient's grandson is a Equities trader.  Patient's son says he is making medical decisions on patient's behalf.  Patient does not have a healthcare power of attorney.  Reportedly patient's 2 daughters are not able to be involved in decision-making due to mental health problems.  At baseline, patient was  doing reasonably well at the ALF. She required minimal assistance with ADLs and could ambulate herself using a rolling walker.   Together, we reviewed patient's current medical problems. Patient has been intermittently lethargic. She has also been intermittently hypothermic requiring bear hugger device today. Renal function slightly improved but not back to baseline. Her oral intake remains minimal and patient is at high risk for recurrent dehydration if IV fluids were stopped.   Family state that patient had been telling them for over a year that she is ready to die. They do not think that patient would want dialysis or other aggressive measures to prolong her life. Family tell me that they do not think that patient is likely to return to her previous functional baseline and I tend to agree. We talked about options for continued care and rehab. Family state they do not want rehab. We explored the option of hospice involvement at home but family are not able to care for her in the home.   Family are requesting residential hospice. I do think this is a reasonable disposition given patient's risk of clinical decline following discontinuation of IV fluids. Family state they are just wanting to keep her comfortable at end of life.    Family confirm DNR.   Case discussed with attending.   PLAN: SW consult for residential hospice DNR  Time Total: 60 minutes  Visit consisted of counseling and education dealing with the complex and emotionally intense issues of symptom management and palliative care in the setting of serious and potentially life-threatening illness.Greater than 50%  of this time was spent counseling and coordinating care related to the above assessment and plan.  Signed by: Altha Harm, PhD, DNP, NP-C, [facility] [phone] (Work Cell)

## 2018-06-14 NOTE — Clinical Social Work Note (Signed)
Physician has updated CSW and stated that she spoke with family and is ordering a Palliative Care consult. Thayer Ohmhris at [facility] [place] has been updated. Current plan is for patient to [facility] to [facility] when time. York SpanielMonica Winnie Umali MSW,LCSW [phone]

## 2018-06-14 NOTE — Progress Notes (Signed)
[facility]   PATIENT NAME: Jean RosenthalChristine Kenton    MR#:  [id number]  DATE OF BIRTH:  04/27/1919  SUBJECTIVE:  CHIEF COMPLAINT:   Chief Complaint  Patient presents with  . Leg Swelling   -Lethargic this morning.  Opening eyes to her name.  Family at bedside -Decreased urine output  REVIEW OF SYSTEMS:  Review of Systems  Constitutional: Positive for malaise/fatigue. Negative for fever.       Hypothermia   HENT: Positive for hearing loss.   Eyes: Negative for blurred vision and double vision.  Respiratory: Negative for cough.   Gastrointestinal: Negative for abdominal pain, diarrhea, heartburn, nausea and vomiting.  Genitourinary: Negative for dysuria and urgency.  Musculoskeletal: Negative for myalgias.  Neurological: Negative for dizziness, sensory change, speech change, focal weakness and weakness.  Psychiatric/Behavioral: Negative for depression.    DRUG ALLERGIES:   Allergies  Allergen Reactions  . Sulfa Antibiotics     VITALS:  Blood pressure 130/62, pulse 70, temperature (!) 97.3 F (36.3 C), temperature source Oral, resp. rate 20, height 5\' 6"  (1.676 m), weight 82.5 kg, SpO2 93 %.  PHYSICAL EXAMINATION:  Physical Exam  GENERAL:  84 y.o.-year-old elderly patient lying in the bed, ill-appearing.  Patient is very hard of hearing EYES: Left pupil is dilated and not reacting to light.  Right pupil is reacting to light.. No scleral icterus. Extraocular muscles intact.  HEENT: Head atraumatic, normocephalic. Oropharynx and nasopharynx clear.  NECK:  Supple, no jugular venous distention. No thyroid enlargement, no tenderness.  LUNGS: Normal breath sounds bilaterally, no wheezing, rales,rhonchi or crepitation.  Appears dyspneic.  No use of accessory muscles of respiration.  Decreased bibasilar breath sounds. CARDIOVASCULAR: S1, S2 normal. No  rubs, or gallops.  2/6 systolic murmur is present ABDOMEN: Soft, nontender, nondistended.  Bowel sounds present. No organomegaly or mass.  EXTREMITIES: No  cyanosis, or clubbing.  1+ bilateral pedal edema noted NEUROLOGIC: Very hard of hearing.  Cranial nerves are intact.  Sensation is intact.  Motor strength is equal in all extremities and able to move all extremities and following simple commands PSYCHIATRIC: The patient is lethargic again today. sKIN: No obvious rash, lesion, or ulcer   LABORATORY PANEL:   CBC Recent Labs  Lab 06/13/18 0421  WBC 3.8*  HGB 10.6*  HCT 32.7*  PLT 99*   ------------------------------------------------------------------------------------------------------------------  Chemistries  Recent Labs  Lab 06/10/18 1750  06/14/18 0403  NA 141   < > 148*  K 4.6   < > 3.0*  CL 103   < > 110  CO2 28   < > 24  GLUCOSE 128*   < > 141*  BUN 68*   < > 90*  CREATININE 2.17*   < > 2.34*  CALCIUM 9.4   < > 9.1  AST 77*  --   --   ALT 94*  --   --   ALKPHOS 71  --   --   BILITOT 0.7  --   --    < > = values in this interval not displayed.   ------------------------------------------------------------------------------------------------------------------  Cardiac Enzymes Recent Labs  Lab 06/10/18 1750  TROPONINI <0.03   ------------------------------------------------------------------------------------------------------------------  RADIOLOGY:  [place] Renal  Result Date: 06/12/2018 CLINICAL DATA:  Acute renal failure. EXAM: RENAL / URINARY TRACT ULTRASOUND COMPLETE COMPARISON:  None. FINDINGS: Right Kidney: Renal measurements: 7.9 x 3.6 x 4.3 cm = volume: 65 mL. Renal cortical thinning with borderline increased parenchymal echogenicity. No mass  or hydronephrosis identified. Left Kidney: Renal measurements: 8.3 x 4.8 x 5.7 cm = volume: 118 mL. Mildly increased parenchymal echogenicity. No mass or hydronephrosis identified. Bladder: Not visualized. IMPRESSION: Evidence of medical renal disease without hydronephrosis. Electronically Signed   By:  Sebastian Ache M.D.   On: 06/12/2018 10:08    EKG:   Orders placed or performed during the hospital encounter of 06/10/18  . ED EKG  . ED EKG  . EKG 12-Lead  . EKG 12-Lead    ASSESSMENT AND PLAN:   Jazlynn Nemetz  is a 84 y.o. 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  1.  Acute renal failure on CKD stage IV-creatinine -baseline around 1.5, now worsened and stable at 2.4 -Decreased urine output.  Restarted IV fluids.  Blood pressure is improved. -Appreciate nephrology consult.  Renal ultrasound with medical renal disease.    - Hold Lasix and metolazone -Discussion with family about dialysis in case kidney function worsens-initially family wanted dialysis, however seeing the long picture they are unsure about it now.  2.    Sepsis-secondary to acute cystitis-  Remains hypothermic.  But mental status is improving. -Blood cultures negative.  Urine cultures growing Proteus.  - -Was taking Cipro as outpatient prior to admission-Proteus was resistant to Cipro.   - on rocephin- Changed to oral Keflex  Now- stop after 7 days total -Stress dose steroids were given for hypotension.  Improved blood pressure.  Off steroids now -Receiving IV fluids  3.  Congestive heart failure- metolazone and Lasix are on hold.  Echocardiogram with normal EF. -Patient did have some sinus bradycardia episodes with sinus pauses-appreciate cardiology input.  Likely secondary to vagal stimulation.  4.  Hypertension-on Norvasc- held due to hypotension  5.  Hypothyroidism-elevated TSH but normal T4, on Synthroid  6.  DVT prophylaxis-subcutaneous heparin.  7.  Acute metabolic encephalopathy-secondary to sepsis, also renal failure.  Discontinued trazodone -  fluctuating mental status  Physical therapy consulted. Family updated at bedside -Discussed multiple scenarios today.  If she improves, she can go to rehab if her kidney function is stable and mental status improving Also discussed if patient continues to have fluctuating mental status, introduced concept of hospice comfort care. -Palliative care consulted    All the records are reviewed and case discussed with Care Management/Social Workerr. Management plans discussed with the patient, family and they are in agreement.  CODE STATUS: Full code  TOTAL  TIME  SPENT IN TAKING CARE OF THIS PATIENT: 38 minutes.   POSSIBLE D/C IN 2 DAYS, DEPENDING ON CLINICAL CONDITION.   Enid Baas M.D on 06/14/2018 at 9:05 AM  Between 7am to 6pm - Pager - [phone]  After 6pm go to [email or web address] - Social research officer, government  [facility]  Office  [phone]  CC: Primary care physician; Chrismon, Jodell Cipro, PA

## 2018-06-14 NOTE — Progress Notes (Signed)
[facility]  ROUNDING NOTE   Subjective:   Family at bedside.   UOP .   Tmin 94  Na 148  Objective:  Vital signs in last 24 hours:  Temp:  [97.3 F (36.3 C)-97.5 F (36.4 C)] 97.3 F (36.3 C) (11/21 0517) Pulse Rate:  [70-79] 70 (11/21 0517) Resp:  [16-22] 20 (11/21 0517) BP: (113-130)/(58-62) 130/62 (11/21 0517) SpO2:  [91 %-93 %] 93 % (11/21 0517)  Weight change:  Filed Weights   06/10/18 1730 06/10/18 2057  Weight: 82.1 kg 82.5 kg    Intake/Output: I/O last 3 completed shifts: In: 660 [P.O.:240; I.V.:420] Out: 812 [Urine:811; Stool:1]   Intake/Output this shift:  No intake/output data recorded.  Physical Exam: General: NAD,   Head: +hard of hearing. Moist oral mucosal membranes  Eyes: Anicteric, PERRL  Neck: Supple, trachea midline  Lungs:  Wheezing bilaterally  Heart: Regular rate and rhythm  Abdomen:  Soft, nontender,   Extremities: No peripheral edema.  Neurologic: Alert to self and place  Skin: No lesions        Basic Metabolic Panel: Recent Labs  Lab 06/10/18 1750 06/11/18 0507 06/12/18 0322 06/13/18 0421 06/14/18 0403  NA 141 142 146* 148* 148*  K 4.6 4.0 3.5 3.4* 3.0*  CL 103 104 108 111 110  CO2 28 28 26  20* 24  GLUCOSE 128* 85 79 104* 141*  BUN 68* 68* 68* 83* 90*  CREATININE 2.17* 2.35* 2.31* 2.41* 2.34*  CALCIUM 9.4 8.7* 8.5* 8.5* 9.1  PHOS  --   --   --   --  5.2*    Liver Function Tests: Recent Labs  Lab 06/10/18 1750 06/14/18 0403  AST 77*  --   ALT 94*  --   ALKPHOS 71  --   BILITOT 0.7  --   PROT 7.2  --   ALBUMIN 3.9 4.0   No results for input(s): LIPASE, AMYLASE in the last 168 hours. No results for input(s): AMMONIA in the last 168 hours.  CBC: Recent Labs  Lab 06/10/18 1750 06/11/18 0507 06/12/18 1331 06/13/18 0421  WBC 5.6 4.8 4.5 3.8*  NEUTROABS 4.7  --   --  3.2  HGB 11.9* 10.6* 10.2* 10.6*  HCT 36.9 32.1* 32.1* 32.7*  MCV 93.9 91.7 95.0 94.0  PLT 129* 115* 89* 99*     Cardiac Enzymes: Recent Labs  Lab 06/10/18 1750  TROPONINI <0.03    BNP: Invalid input(s): POCBNP  CBG: No results for input(s): GLUCAP in the last 168 hours.  Microbiology: Results for orders placed or performed during the hospital encounter of 06/10/18  Urine culture     Status: Abnormal   Collection Time: 06/10/18  5:50 PM  Result Value Ref Range Status   Specimen Description   Final    URINE, RANDOM Performed at [facility], [address], [place], [place] [address]    Special Requests   Final    NONE Performed at [facility], [address] [place], [place] [address]    Culture >=100,000 COLONIES/mL PROTEUS MIRABILIS (A)  Final   Report Status 06/13/2018 FINAL  Final   Organism ID, Bacteria PROTEUS MIRABILIS (A)  Final      Susceptibility   Proteus mirabilis - MIC*    AMPICILLIN <=2 SENSITIVE Sensitive     CEFAZOLIN <=4 SENSITIVE Sensitive     CEFTRIAXONE <=1 SENSITIVE Sensitive     CIPROFLOXACIN >=4 RESISTANT Resistant     GENTAMICIN <=1 SENSITIVE Sensitive  IMIPENEM 4 SENSITIVE Sensitive     NITROFURANTOIN 128 RESISTANT Resistant     TRIMETH/SULFA >=320 RESISTANT Resistant     AMPICILLIN/SULBACTAM <=2 SENSITIVE Sensitive     PIP/TAZO <=4 SENSITIVE Sensitive     * >=100,000 COLONIES/mL PROTEUS MIRABILIS  Blood Culture (routine x 2)     Status: None (Preliminary result)   Collection Time: 06/10/18  5:52 PM  Result Value Ref Range Status   Specimen Description BLOOD RFA  Final   Special Requests   Final    BOTTLES DRAWN AEROBIC AND ANAEROBIC Blood Culture adequate volume   Culture   Final    NO GROWTH 4 DAYS Performed at [facility], [address] [place], [place] [address]    Report Status PENDING  Incomplete  Blood Culture (routine x 2)     Status: None (Preliminary result)   Collection Time: 06/10/18  5:52 PM  Result Value Ref Range Status   Specimen Description BLOOD RAC  Final   Special Requests   Final     BOTTLES DRAWN AEROBIC AND ANAEROBIC Blood Culture adequate volume   Culture   Final    NO GROWTH 4 DAYS Performed at [facility], [address] [place], [place] [address]    Report Status PENDING  Incomplete    Coagulation Studies: No results for input(s): LABPROT, INR in the last 72 hours.  Urinalysis: No results for input(s): COLORURINE, LABSPEC, PHURINE, GLUCOSEU, HGBUR, BILIRUBINUR, KETONESUR, PROTEINUR, UROBILINOGEN, NITRITE, LEUKOCYTESUR in the last 72 hours.  Invalid input(s): APPERANCEUR    Imaging: No results found.   Medications:   . sodium chloride 75 mL/hr at 06/14/18 1005  . potassium chloride 10 mEq (06/14/18 1114)   . cephALEXin  250 mg Oral Q8H  . heparin  5,000 Units Subcutaneous Q8H  . latanoprost  1 drop Right Eye QHS  . levothyroxine  50 mcg Oral Q0600   acetaminophen **OR** acetaminophen, ondansetron **OR** ondansetron (ZOFRAN) IV, polyethylene glycol  Assessment/ Plan:  Ms. Brandy Schroeder is a 84 y.o. white female with diastolic congestive heart failure, depression, hypothyroidism, arthritis, left eye blindness, glaucoma, who was [facility] 06/10/2018  1. Acute Renal Failure on chronic kidney disease stage IV. Baseline creatinine of 1.55, GFR of 28 on 12/29/17.  Acute renal failure most likely from overdiuresis leading to ATN.  Nonoliguric urine output.  No change in creatinine however elevated BUN.  - IV fluids - Hold diuretics.  - no indication for dialysis at this time. Family states they would consider dialysis only if necessary.  2. Anemia of chronic kidney disease: hemoglobin 10.6  3. Acute exacerbation of diastolic congestive heart failure - off diuretics  - monitor volume status.  4. Urinary tract infection: urine culture positive on 11/17 with proteus mirabilis.  - ceftriaxone   LOS: 4 Ahman Dugdale 11/21/201912:14 PM

## 2018-06-15 LAB — CBC
HCT: 32.9 % — ABNORMAL LOW (ref 36.0–46.0)
HEMOGLOBIN: 10.4 g/dL — AB (ref 12.0–15.0)
MCH: 30.1 pg (ref 26.0–34.0)
MCHC: 31.6 g/dL (ref 30.0–36.0)
MCV: 95.4 fL (ref 80.0–100.0)
PLATELETS: 130 10*3/uL — AB (ref 150–400)
RBC: 3.45 MIL/uL — ABNORMAL LOW (ref 3.87–5.11)
RDW: 15.8 % — AB (ref 11.5–15.5)
WBC: 6.5 10*3/uL (ref 4.0–10.5)
nRBC: 0 % (ref 0.0–0.2)

## 2018-06-15 LAB — CULTURE, BLOOD (ROUTINE X 2)
Culture: NO GROWTH
Culture: NO GROWTH
Special Requests: ADEQUATE
Special Requests: ADEQUATE

## 2018-06-15 LAB — BASIC METABOLIC PANEL
Anion gap: 7 (ref 5–15)
BUN: 78 mg/dL — AB (ref 8–23)
CHLORIDE: 115 mmol/L — AB (ref 98–111)
CO2: 27 mmol/L (ref 22–32)
CREATININE: 1.85 mg/dL — AB (ref 0.44–1.00)
Calcium: 8.8 mg/dL — ABNORMAL LOW (ref 8.9–10.3)
GFR calc non Af Amer: 22 mL/min — ABNORMAL LOW (ref >60)
GFR, EST AFRICAN AMERICAN: 25 mL/min — AB (ref >60)
Glucose, Bld: 123 mg/dL — ABNORMAL HIGH (ref 70–99)
Potassium: 3 mmol/L — ABNORMAL LOW (ref 3.5–5.1)
SODIUM: 149 mmol/L — AB (ref 135–145)

## 2018-06-15 MED ORDER — LACTATED RINGERS IV SOLN
INTRAVENOUS | Status: DC
  Administered 2018-06-15: 04:00:00 via INTRAVENOUS

## 2018-06-15 MED ORDER — HYDROMORPHONE HCL 1 MG/ML IJ SOLN
0.5000 mg | Freq: Once | INTRAMUSCULAR | Status: AC
  Administered 2018-06-15: 0.5 mg via INTRAVENOUS
  Filled 2018-06-15: qty 0.5

## 2018-06-15 MED ORDER — MORPHINE SULFATE (CONCENTRATE) 20 MG/ML PO SOLN: 5.0000 mg | ORAL | 0 refills | Status: AC | PRN

## 2018-06-15 MED ORDER — METHYLPREDNISOLONE SODIUM SUCC 125 MG IJ SOLR
60.0000 mg | Freq: Once | INTRAMUSCULAR | Status: AC
  Administered 2018-06-15: 60 mg via INTRAVENOUS
  Filled 2018-06-15: qty 2

## 2018-06-15 MED ORDER — IPRATROPIUM-ALBUTEROL 0.5-2.5 (3) MG/3ML IN SOLN: 3.0000 mL | Freq: Once | RESPIRATORY_TRACT | Status: DC

## 2018-06-15 MED ORDER — POTASSIUM CHLORIDE 20 MEQ PO PACK
40.0000 meq | PACK | Freq: Two times a day (BID) | ORAL | Status: DC
  Filled 2018-06-15: qty 2

## 2018-06-15 NOTE — Discharge Summary (Signed)
[facility] [place] [facility]   PATIENT NAME: Brandy Schroeder    MR#:  [id number]  DATE OF BIRTH:  02/24/1919  DATE OF ADMISSION:  06/10/2018   ADMITTING PHYSICIAN: Enid Baasadhika Carlosdaniel Grob, MD  DATE OF DISCHARGE:  06/15/18  PRIMARY CARE PHYSICIAN: Chrismon, Jodell Ciproennis E, PA   ADMISSION DIAGNOSIS:   Leg swelling [M79.89] Cystitis [N30.90] Sepsis, due to unspecified organism, unspecified whether acute organ dysfunction present (HCC) [A41.9]  DISCHARGE DIAGNOSIS:   Active Problems:   Sepsis (HCC)   Bradycardia   ARF (acute renal failure) (HCC)   Palliative care encounter   SECONDARY DIAGNOSIS:   Past Medical History:  Diagnosis Date  . Anxiety   . Blind left eye   . CHF (congestive heart failure) (HCC)   . CKD (chronic kidney disease)   . Depression   . Hypothyroidism   . 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  1. Acute renal failure on CKD stage IV-creatinine -baseline around 1.5,  - now at 1.8 - after IV fluids and IV stress dose steroids -Decreased urine output.  Blood pressure is improved. -Appreciate nephrology consult.  Renal ultrasound with medical renal disease.    - Hold Lasix and metolazone -Discussion with family about dialysis in case kidney function worsens-family do not want her to be on dialysis.  2.   Sepsis-secondary to acute cystitis-  Remains hypothermic.  But mental status is fluctuating -Blood cultures negative.  Urine cultures growing Proteus.  - on rocephin- Changed to oral Keflex - finished trt - Currently off of steroids improved blood pressure.  3. Congestive heart failure- metolazone and  Lasix are on hold.  Echocardiogram with normal EF. -Patient did have some sinus bradycardia episodes with sinus pauses-appreciate cardiology input.  Likely secondary to vagal stimulation.  4. Hypertension-on Norvasc- held due to hypotension  5. Hypothyroidism-elevated TSH but normal T4, on Synthroid  6.  Acute metabolic encephalopathy-secondary to sepsis, also renal failure.  Discontinued trazodone - fluctuating mental status- no significant improvement  Physical therapy consulted. Patient will not be able to participate in therapy Family updated at bedside - appreciate palliative care input.  Patient will be discharged to [facility]   DISCHARGE CONDITIONS:   Critical  CONSULTS OBTAINED:   Palliative Care consult Nephrology consult  DRUG ALLERGIES:   Allergies  Allergen Reactions  . Sulfa Antibiotics    DISCHARGE MEDICATIONS:   Allergies as of 06/15/2018      Reactions   Sulfa Antibiotics       Medication List    STOP taking these medications   Alpha-Lipoic Acid 300 MG Caps   amLODipine 10 MG tablet Commonly known as:  NORVASC   ciprofloxacin 500 MG tablet Commonly known as:  CIPRO   dextromethorphan-guaiFENesin 30-600 MG 12hr tablet Commonly known as:  MUCINEX DM   doxycycline 100 MG tablet Commonly known as:  VIBRA-TABS   furosemide 40 MG tablet Commonly known as:  LASIX   loperamide 2 MG tablet Commonly known as:  IMODIUM A-D   metolazone 2.5 MG tablet Commonly known as:  ZAROXOLYN   traZODone 150 MG tablet Commonly known as:  DESYREL     TAKE these medications   acetaminophen 500 MG tablet Commonly known as:  TYLENOL Take 500 mg by mouth every 6 (six) hours as needed.   D3 SUPER STRENGTH 50 MCG (2000 UT) Caps Generic drug:  Cholecalciferol TAKE (1) CAPSULE BY MOUTH ONCE DAILY.   latanoprost 0.005 % ophthalmic solution Commonly known as:  XALATAN Place 1 drop into the right eye at bedtime.   levothyroxine 50 MCG  tablet Commonly known as:  SYNTHROID, LEVOTHROID TAKE 1 TABLET BY MOUTH ONCE DAILY BEFORE BREAKFAST.   morphine 20 MG/ML concentrated solution Commonly known as:  ROXANOL Take 0.25 mLs (5 mg total) by mouth every 2 (two) hours as needed for moderate pain, severe pain, breakthrough pain or shortness of breath.   polyethylene glycol powder powder Commonly known as:  GLYCOLAX/MIRALAX Take 17 g by mouth 2 (two) times daily as needed. Mix powder in 8 ounces of Gatorade.        DISCHARGE INSTRUCTIONS:   1. PCP f/u as needed  DIET:   Regular diet  ACTIVITY:   Activity as tolerated  OXYGEN:   Home Oxygen: Yes.    Oxygen Delivery: 2 liters/min via Patient connected to nasal cannula oxygen  DISCHARGE LOCATION:   Hospice Home   If you experience worsening of your admission symptoms, develop shortness of breath, life threatening emergency, suicidal or homicidal thoughts you must seek medical attention immediately by calling 911 or calling your MD immediately  if symptoms less severe.  You Must read complete instructions/literature along with all the possible adverse reactions/side effects for all the Medicines you take and that have been prescribed to you. Take any new Medicines after you have completely understood and accpet all the possible adverse reactions/side effects.   Please note  You were cared for by a hospitalist during your hospital stay. If you have any questions about your discharge medications or the care you received while you were in the hospital after you are discharged, you can call the unit and asked to speak with the hospitalist on call if the hospitalist that took care of you is not available. Once you are discharged, your primary care physician will handle any further medical issues. Please note that NO REFILLS for any discharge medications will be authorized once you are discharged, as it is imperative that you return to your primary care physician (or establish  a relationship with a primary care physician if you do not have one) for your aftercare needs so that they can reassess your need for medications and monitor your lab values.    On the day of Discharge:  VITAL SIGNS:   Blood pressure (!) 131/109, pulse 72, temperature (!) 94 F (34.4 C), temperature source Rectal, resp. rate 16, height 5\' 6"  (1.676 m), weight 82.5 kg, SpO2 95 %.  PHYSICAL EXAMINATION:   GENERAL:84 y.o.-year-oldelderlypatient lying in the bed, ill-appearing. Patient is very hard of hearing EYES:Left pupil is dilated and not reacting to light. Right pupil is reacting to light.. No scleral icterus. Extraocular muscles intact.  HEENT: Head atraumatic, normocephalic. Oropharynx and nasopharynx clear.  NECK: Supple, no jugular venous distention. No thyroid enlargement, no tenderness.  LUNGS: Normal breath sounds bilaterally, no wheezing, rales,rhonchi or crepitation.  Appears dyspneic.  No use of accessory muscles of respiration.Decreased bibasilar breath sounds. CARDIOVASCULAR: S1, S2 normal. No rubs, or gallops. 2/6 systolic murmur is present ABDOMEN: Soft, nontender, nondistended. Bowel sounds present. No organomegaly or mass.  EXTREMITIES: No cyanosis, or clubbing. 1+ bilateral pedal edema noted NEUROLOGIC: Very hard of hearing.  Cranial nerves are intact.  Sensation is intact.  Motor strength is equal in all extremities and able to move all extremities and following simple commands PSYCHIATRIC: The patient is drowsy, opening eyes to her name and able to answer simple questions sKIN: No obvious rash, lesion, or ulcer.   DATA REVIEW:   CBC Recent Labs  Lab 06/15/18 0555  WBC 6.5  HGB 10.4*  HCT 32.9*  PLT 130*    Chemistries  Recent Labs  Lab 06/10/18 1750  06/15/18 0555  NA 141   < > 149*  K 4.6   < > 3.0*  CL 103   < > 115*  CO2 28   < > 27  GLUCOSE 128*   < > 123*  BUN 68*   < > 78*  CREATININE 2.17*   < > 1.85*  CALCIUM 9.4   < > 8.8*   AST 77*  --   --   ALT 94*  --   --   ALKPHOS 71  --   --   BILITOT 0.7  --   --    < > = values in this interval not displayed.     Microbiology Results  Results for orders placed or performed during the hospital encounter of 06/10/18  Urine culture     Status: Abnormal   Collection Time: 06/10/18  5:50 PM  Result Value Ref Range Status   Specimen Description   Final    URINE, RANDOM Performed at [facility], [address] [place], [place] [address]    Special Requests   Final    NONE Performed at [facility], [address] [place], [place] [address]    Culture >=100,000 COLONIES/mL PROTEUS MIRABILIS (A)  Final   Report Status 06/13/2018 FINAL  Final   Organism ID, Bacteria PROTEUS MIRABILIS (A)  Final      Susceptibility   Proteus mirabilis - MIC*    AMPICILLIN <=2 SENSITIVE Sensitive     CEFAZOLIN <=4 SENSITIVE Sensitive     CEFTRIAXONE <=1 SENSITIVE Sensitive     CIPROFLOXACIN >=4 RESISTANT Resistant     GENTAMICIN <=1 SENSITIVE Sensitive     IMIPENEM 4 SENSITIVE Sensitive     NITROFURANTOIN 128 RESISTANT Resistant     TRIMETH/SULFA >=320 RESISTANT Resistant     AMPICILLIN/SULBACTAM <=2 SENSITIVE Sensitive     PIP/TAZO <=4 SENSITIVE Sensitive     * >=100,000 COLONIES/mL PROTEUS MIRABILIS  Blood Culture (routine x 2)     Status: None   Collection Time: 06/10/18  5:52 PM  Result Value Ref Range Status   Specimen Description BLOOD RFA  Final   Special Requests   Final    BOTTLES DRAWN AEROBIC AND ANAEROBIC Blood Culture adequate volume   Culture   Final    NO GROWTH 5 DAYS Performed at [facility], [address], [place], [place] [address]    Report Status 06/15/2018 FINAL  Final  Blood Culture (routine x 2)     Status: None   Collection Time: 06/10/18  5:52 PM  Result Value Ref Range Status   Specimen Description BLOOD RAC  Final   Special Requests   Final    BOTTLES DRAWN AEROBIC AND ANAEROBIC Blood Culture adequate  volume   Culture   Final    NO GROWTH 5 DAYS Performed at [facility], [address] [place], [place] [address]    Report Status 06/15/2018 FINAL  Final    RADIOLOGY:  No results found.   Management plans discussed with the  patient, family and they are in agreement.  CODE STATUS:     Code Status Orders  (From admission, onward)         Start     Ordered   06/10/18 2056  Do not attempt resuscitation (DNR)  Continuous    Question Answer Comment  In the event of cardiac or respiratory ARREST Do not call a "code blue"   In the event of cardiac or respiratory ARREST Do not perform Intubation, CPR, defibrillation or ACLS   In the event of cardiac or respiratory ARREST Use medication by any route, position, wound care, and other measures to relive pain and suffering. May use oxygen, suction and manual treatment of airway obstruction as needed for comfort.      06/10/18 2055        Code Status History    Date Active Date Inactive Code Status Order ID Comments User Context   09/06/2015 0033 09/08/2015 2017 DNR [id number]  Oralia Manis, MD Inpatient      TOTAL TIME TAKING CARE OF THIS PATIENT: 38 minutes.    Enid Baas M.D on 06/15/2018 at 12:56 PM  Between 7am to 6pm - Pager - [phone]  After 6pm go to [email or web address] - Social research officer, government  [facility]  Office  [phone]  CC: Primary care physician; Tamsen Roers, PA   Note: This dictation was prepared with Dragon dictation along with smaller phrase technology. Any transcriptional errors that result from this process are unintentional.

## 2018-06-15 NOTE — Care Management Important Message (Signed)
Copy of signed IM left with patient in room.  

## 2018-06-15 NOTE — Progress Notes (Signed)
[place] referral.received from [place] following a Palliative medicine consult. Patient is a 84 year old woman with multiple medical problems including CKD stage IV, history of CHF, macular degeneration and retinal detachment with legal blindness in the left eye, hypothyroidism, and OA,  Admitted to [facility] from home on 06/10/2018 with altered mental status and weakness.  Patient was recently treated outpatient for UTI and for increased weight gain and edema.  On admission she was found to have UTI and acute on chronic renal failure. Her renal function has continued to decline and she has not tolerated discontinuation of IV fluids, she is eating only biters and sips. Palliative Medicine NP Merrily Pew Borders met with patient and family and they have chosen to focus on comfort with transfer to the hospice home.  Writer met in the room with patient's son in law and [place], son Jefm Miles, daughter in law Katharine Look and grandson Marya Amsler to initiate education regarding hospice services, philosophy and team approach to care with understanding voiced, questions answered, consent signed. Patient did awaken to voice and was able to greet writer, but quickly drifted back to sleep. She is eating only bites and sips. Family reports she has been sleeping most of the time, with periods of extreme restlessness at night. Assurance given that symptoms would be managed at the hospice home. Plan is for discharge to the hospice home today via EMS with signed out of facility DNR in place. Patient information faxed to referral, hospital care team updated, report called to the hospice home and EMS notified for transport.Thank you for the opportunity to be involved in the care of this patient and her family. Flo Shanks RN, BS, [facility], hospital liaison [phone]

## 2018-06-15 NOTE — Progress Notes (Signed)
[facility]  ROUNDING NOTE   Subjective:   Family at bedside.   Patient being moved to hospice.   Objective:  Vital signs in last 24 hours:  Temp:  [94 F (34.4 C)] 94 F (34.4 C) (11/21 1454) Pulse Rate:  [72] 72 (11/21 2109) BP: (131)/(109) 131/109 (11/21 2109) SpO2:  [95 %] 95 % (11/21 2109)  Weight change:  Filed Weights   06/10/18 1730 06/10/18 2057  Weight: 82.1 kg 82.5 kg    Intake/Output: I/O last 3 completed shifts: In: 120 [P.O.:120] Out: 1320 [Urine:1320]   Intake/Output this shift:  Total I/O In: 520.4 [P.O.:120; I.V.:400.4] Out: -   Physical Exam: General: NAD,   Head: +hard of hearing. Moist oral mucosal membranes  Eyes: Anicteric, PERRL  Neck: Supple, trachea midline  Lungs:  Wheezing bilaterally  Heart: Regular rate and rhythm  Abdomen:  Soft, nontender,   Extremities: No peripheral edema.  Neurologic: Alert to self and place  Skin: No lesions        Basic Metabolic Panel: Recent Labs  Lab 06/11/18 0507 06/12/18 0322 06/13/18 0421 06/14/18 0403 06/15/18 0555  NA 142 146* 148* 148* 149*  K 4.0 3.5 3.4* 3.0* 3.0*  CL 104 108 111 110 115*  CO2 28 26 20* 24 27  GLUCOSE 85 79 104* 141* 123*  BUN 68* 68* 83* 90* 78*  CREATININE 2.35* 2.31* 2.41* 2.34* 1.85*  CALCIUM 8.7* 8.5* 8.5* 9.1 8.8*  PHOS  --   --   --  5.2*  --     Liver Function Tests: Recent Labs  Lab 06/10/18 1750 06/14/18 0403  AST 77*  --   ALT 94*  --   ALKPHOS 71  --   BILITOT 0.7  --   PROT 7.2  --   ALBUMIN 3.9 4.0   No results for input(s): LIPASE, AMYLASE in the last 168 hours. No results for input(s): AMMONIA in the last 168 hours.  CBC: Recent Labs  Lab 06/10/18 1750 06/11/18 0507 06/12/18 1331 06/13/18 0421 06/15/18 0555  WBC 5.6 4.8 4.5 3.8* 6.5  NEUTROABS 4.7  --   --  3.2  --   HGB 11.9* 10.6* 10.2* 10.6* 10.4*  HCT 36.9 32.1* 32.1* 32.7* 32.9*  MCV 93.9 91.7 95.0 94.0 95.4  PLT 129* 115* 89* 99* 130*    Cardiac  Enzymes: Recent Labs  Lab 06/10/18 1750  TROPONINI <0.03    BNP: Invalid input(s): POCBNP  CBG: No results for input(s): GLUCAP in the last 168 hours.  Microbiology: Results for orders placed or performed during the hospital encounter of 06/10/18  Urine culture     Status: Abnormal   Collection Time: 06/10/18  5:50 PM  Result Value Ref Range Status   Specimen Description   Final    URINE, RANDOM Performed at [facility], [address], [place], [place] [address]    Special Requests   Final    NONE Performed at [facility], [address] [place], [place] [address]    Culture >=100,000 COLONIES/mL PROTEUS MIRABILIS (A)  Final   Report Status 06/13/2018 FINAL  Final   Organism ID, Bacteria PROTEUS MIRABILIS (A)  Final      Susceptibility   Proteus mirabilis - MIC*    AMPICILLIN <=2 SENSITIVE Sensitive     CEFAZOLIN <=4 SENSITIVE Sensitive     CEFTRIAXONE <=1 SENSITIVE Sensitive     CIPROFLOXACIN >=4 RESISTANT Resistant     GENTAMICIN <=1 SENSITIVE Sensitive  IMIPENEM 4 SENSITIVE Sensitive     NITROFURANTOIN 128 RESISTANT Resistant     TRIMETH/SULFA >=320 RESISTANT Resistant     AMPICILLIN/SULBACTAM <=2 SENSITIVE Sensitive     PIP/TAZO <=4 SENSITIVE Sensitive     * >=100,000 COLONIES/mL PROTEUS MIRABILIS  Blood Culture (routine x 2)     Status: None   Collection Time: 06/10/18  5:52 PM  Result Value Ref Range Status   Specimen Description BLOOD RFA  Final   Special Requests   Final    BOTTLES DRAWN AEROBIC AND ANAEROBIC Blood Culture adequate volume   Culture   Final    NO GROWTH 5 DAYS Performed at [facility], [address] [place], [place] [address]    Report Status 06/15/2018 FINAL  Final  Blood Culture (routine x 2)     Status: None   Collection Time: 06/10/18  5:52 PM  Result Value Ref Range Status   Specimen Description BLOOD RAC  Final   Special Requests   Final    BOTTLES DRAWN AEROBIC AND ANAEROBIC Blood Culture  adequate volume   Culture   Final    NO GROWTH 5 DAYS Performed at [facility], [address] [place], [place] [address]    Report Status 06/15/2018 FINAL  Final    Coagulation Studies: No results for input(s): LABPROT, INR in the last 72 hours.  Urinalysis: No results for input(s): COLORURINE, LABSPEC, PHURINE, GLUCOSEU, HGBUR, BILIRUBINUR, KETONESUR, PROTEINUR, UROBILINOGEN, NITRITE, LEUKOCYTESUR in the last 72 hours.  Invalid input(s): APPERANCEUR    Imaging: No results found.   Medications:   . lactated ringers 50 mL/hr at 06/15/18 1228   . cephALEXin  250 mg Oral Q8H  . heparin  5,000 Units Subcutaneous Q8H  . ipratropium-albuterol  3 mL Nebulization Once  . latanoprost  1 drop Right Eye QHS  . levothyroxine  50 mcg Oral Q0600  . potassium chloride  40 mEq Oral BID   acetaminophen **OR** acetaminophen, ibuprofen, ondansetron **OR** ondansetron (ZOFRAN) IV, polyethylene glycol  Assessment/ Plan:  Ms. Brandy Schroeder is a 84 y.o. white female with diastolic congestive heart failure, depression, hypothyroidism, arthritis, left eye blindness, glaucoma, who [facility]10[facility]  1. Acute Renal Failure on chronic kidney disease stage IV. Baseline creatinine of 1.55, GFR of 28 on 12/29/17.  Acute renal failure most likely from overdiuresis leading to ATN.  Oliguric urine output.  Creatinine improving.  - hold IV fluids - Hold diuretics.  - no indication for dialysis at this time.   2. Anemia of chronic kidney disease   3. Acute exacerbation of diastolic congestive heart failure - off diuretics   4. Urinary tract infection: urine culture positive on 11/17 with proteus mirabilis.      LOS: 5 [place] 11/22/20191:59 PM

## 2018-06-15 NOTE — Progress Notes (Signed)
PT Cancellation Note  Patient Details Name: Romilda JoyChristine M Ketron MRN: [id number] DOB: 04/01/1919   Cancelled Treatment:    Reason Eval/Treat Not Completed: Other (comment).  Pt's family declining rehab and pt planning for hospice home placement.  PT will sign off.    Encarnacion ChuAshley Devin Foskey PT, DPT 06/15/2018, 12:46 PM

## 2018-06-15 NOTE — Clinical Social Work Note (Signed)
Patient's family met with Palliative yesterday and they have decided on hospice home placement. CSW met with patient's family at bedside. Family states that they would like patient to go to [place] home. CSW notified Santiago Glad, hospice liaison of referral. CSW will continue to follow for discharge planning.   [place], [place]

## 2018-06-15 NOTE — Progress Notes (Signed)
[facility] - [place] [facility]   PATIENT NAME: Brandy RosenthalChristine Schroeder    MR#:  [id number]  DATE OF BIRTH:  05/02/1919  SUBJECTIVE:  CHIEF COMPLAINT:   Chief Complaint  Patient presents with  . Leg Swelling   -Renal labs improved today, however clinically not significant improvement.  Still with poor oral intake. - fluctuating mental status  REVIEW OF SYSTEMS:  Review of Systems  Constitutional: Positive for malaise/fatigue. Negative for fever.       Hypothermia   HENT: Positive for hearing loss.   Eyes: Negative for blurred vision and double vision.  Respiratory: Negative for cough.   Gastrointestinal: Negative for abdominal pain, diarrhea, heartburn, nausea and vomiting.  Genitourinary: Negative for dysuria and urgency.  Musculoskeletal: Negative for myalgias.  Neurological: Negative for dizziness, sensory change, speech change, focal weakness and weakness.  Psychiatric/Behavioral: Negative for depression.    DRUG ALLERGIES:   Allergies  Allergen Reactions  . Sulfa Antibiotics     VITALS:  Blood pressure (!) 131/109, pulse 72, temperature (!) 94 F (34.4 C), temperature source Rectal, resp. rate 16, height 5\' 6"  (1.676 m), weight 82.5 kg, SpO2 95 %.  PHYSICAL EXAMINATION:  Physical Exam  GENERAL:  84 y.o.-year-old elderly patient lying in the bed, ill-appearing.  Patient is very hard of hearing EYES: Left pupil is dilated and not reacting to light.  Right pupil is reacting to light.. No scleral icterus. Extraocular muscles intact.  HEENT: Head atraumatic, normocephalic. Oropharynx and nasopharynx clear.  NECK:  Supple, no jugular venous distention. No thyroid enlargement, no tenderness.  LUNGS: Normal breath sounds bilaterally, no wheezing, rales,rhonchi or crepitation.  Appears dyspneic.  No use of accessory muscles of respiration.  Decreased bibasilar breath sounds. CARDIOVASCULAR: S1, S2 normal. No  rubs, or gallops.  2/6 systolic murmur is  present ABDOMEN: Soft, nontender, nondistended. Bowel sounds present. No organomegaly or mass.  EXTREMITIES: No  cyanosis, or clubbing.  1+ bilateral pedal edema noted NEUROLOGIC: Very hard of hearing.  Cranial nerves are intact.  Sensation is intact.  Motor strength is equal in all extremities and able to move all extremities and following simple commands PSYCHIATRIC: The patient is drowsy, opening eyes to her name and able to answer simple questions sKIN: No obvious rash, lesion, or ulcer   LABORATORY PANEL:   CBC Recent Labs  Lab 06/15/18 0555  WBC 6.5  HGB 10.4*  HCT 32.9*  PLT 130*   ------------------------------------------------------------------------------------------------------------------  Chemistries  Recent Labs  Lab 06/10/18 1750  06/15/18 0555  NA 141   < > 149*  K 4.6   < > 3.0*  CL 103   < > 115*  CO2 28   < > 27  GLUCOSE 128*   < > 123*  BUN 68*   < > 78*  CREATININE 2.17*   < > 1.85*  CALCIUM 9.4   < > 8.8*  AST 77*  --   --   ALT 94*  --   --   ALKPHOS 71  --   --   BILITOT 0.7  --   --    < > = values in this interval not displayed.   ------------------------------------------------------------------------------------------------------------------  Cardiac Enzymes Recent Labs  Lab 06/10/18 1750  TROPONINI <0.03   ------------------------------------------------------------------------------------------------------------------  RADIOLOGY:  No results found.  EKG:   Orders placed or performed during the hospital encounter of 06/10/18  . ED EKG  . ED EKG  . EKG 12-Lead  . EKG 12-Lead  ASSESSMENT AND PLAN:   Alira Fretwell  is a 84 y.o. 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  1.   Acute renal failure on CKD stage IV-creatinine -baseline around 1.5,  worsened and stable at 2.4- now at 1.8 -Decreased urine output. discontinue IV fluids.  Blood pressure is improved. -Appreciate nephrology consult.  Renal ultrasound with medical renal disease.    - Hold Lasix and metolazone -Discussion with family about dialysis in case kidney function worsens-family do not want her to be on dialysis.  2.    Sepsis-secondary to acute cystitis-  Remains hypothermic.  But mental status is fluctuating -Blood cultures negative.  Urine cultures growing Proteus.  - -Was taking Cipro as outpatient prior to admission-Proteus was resistant to Cipro.   - on rocephin- Changed to oral Keflex  Now- stop after 7 days total -Stress dose steroids were given for hypotension.  Currently off of steroids improved blood pressure.  3.  Congestive heart failure- metolazone and Lasix are on hold.  Echocardiogram with normal EF. -Patient did have some sinus bradycardia episodes with sinus pauses-appreciate cardiology input.  Likely secondary to vagal stimulation.  4.  Hypertension-on Norvasc- held due to hypotension  5.  Hypothyroidism-elevated TSH but normal T4, on Synthroid  6.  DVT prophylaxis-subcutaneous heparin.  7.  Acute metabolic encephalopathy-secondary to sepsis, also renal failure.  Discontinued trazodone - fluctuating mental status  Physical therapy consulted. Family updated at bedside -Discussed multiple scenarios-appreciate palliative care input. Family leaning towards hospice home.    All the records are reviewed and case discussed with Care Management/Social Workerr. Management plans discussed with the patient, family and they are in agreement.  CODE STATUS: Full code  TOTAL  TIME  SPENT IN TAKING CARE OF THIS PATIENT: 41 minutes.   POSSIBLE D/C IN 1-2 DAYS, DEPENDING ON CLINICAL CONDITION.   Enid Baas M.D on 06/15/2018 at 12:49 PM  Between 7am to 6pm - Pager -  [phone]  After 6pm go to [email or web address] - Social research officer, government  [facility]  Office  [phone]  CC: Primary care physician; Chrismon, Jodell Cipro, PA

## 2018-07-25 DEATH — deceased

## 2018-11-27 ENCOUNTER — Ambulatory Visit: Payer: Medicare Other | Admitting: Podiatry

## 2019-08-04 IMAGING — US US RENAL
1 series · 14 of 25 positions shown · non-contrast
Comparison: None.

CLINICAL DATA: Acute renal failure.

EXAM:
RENAL / URINARY TRACT ULTRASOUND COMPLETE

[Series 1: us renal · 0.24mm/px · 14 of 50 slices shown]
[im 1/50]
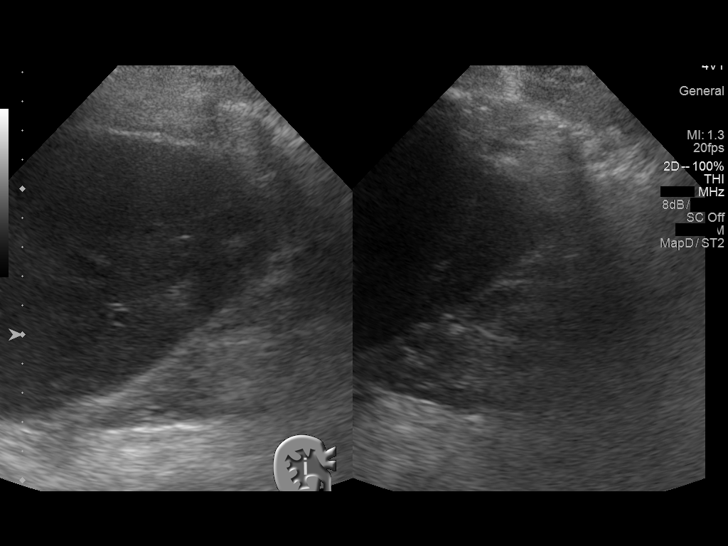
[im 5/50]
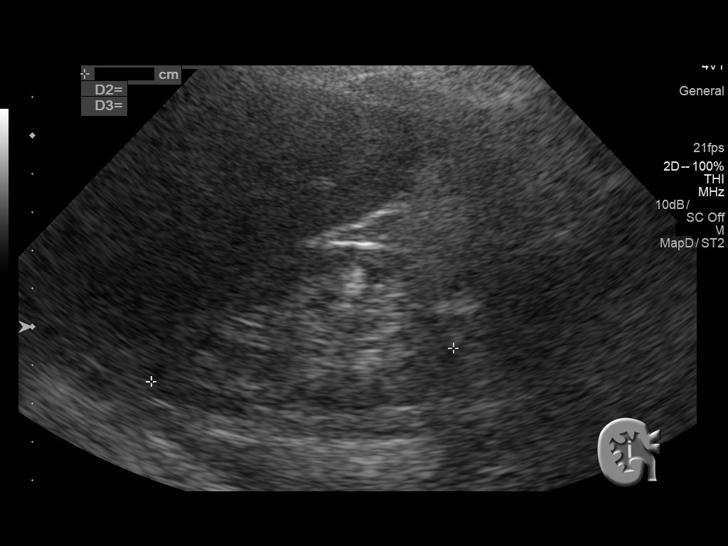
[im 9/50]
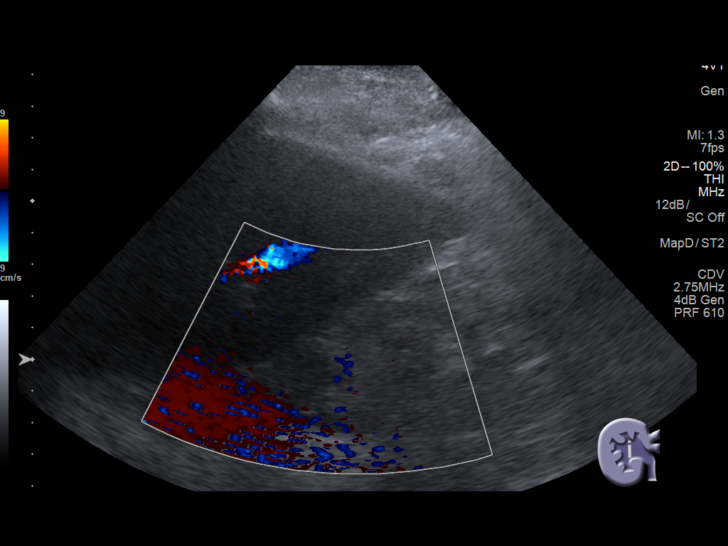
[im 13/50]
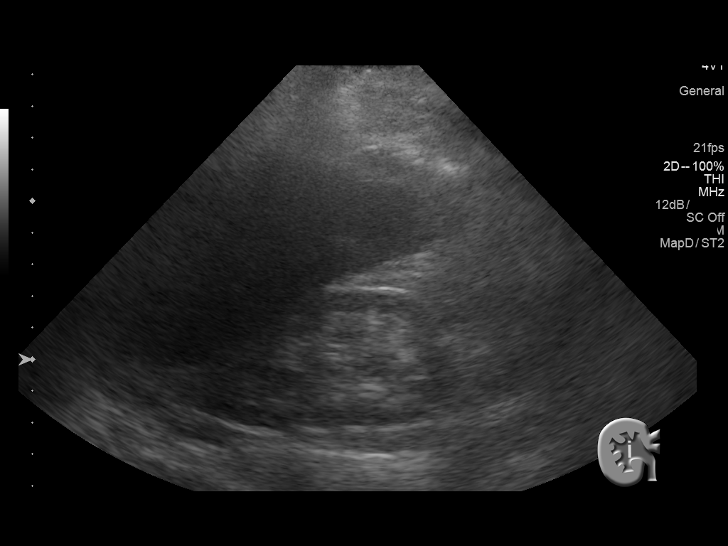
[im 17/50]
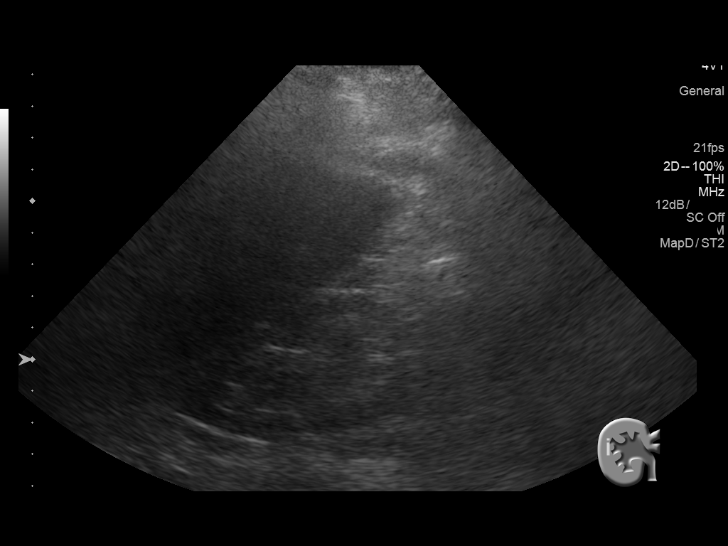
[im 19/50]
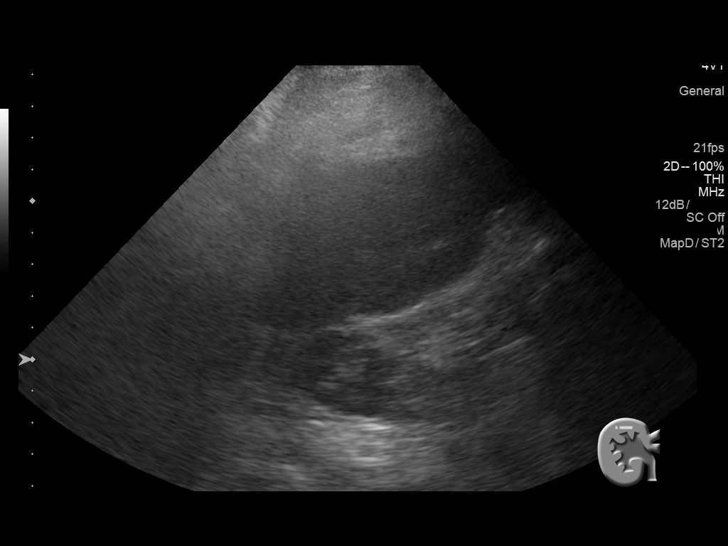
[im 23/50]
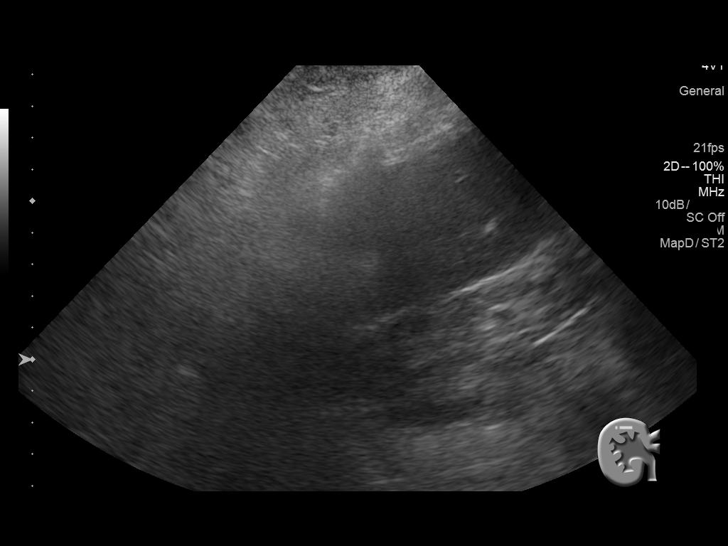
[im 27/50]
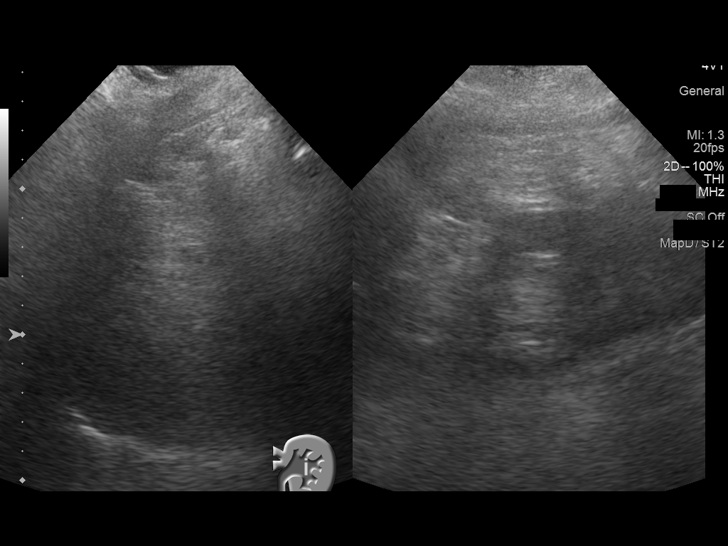
[im 31/50]
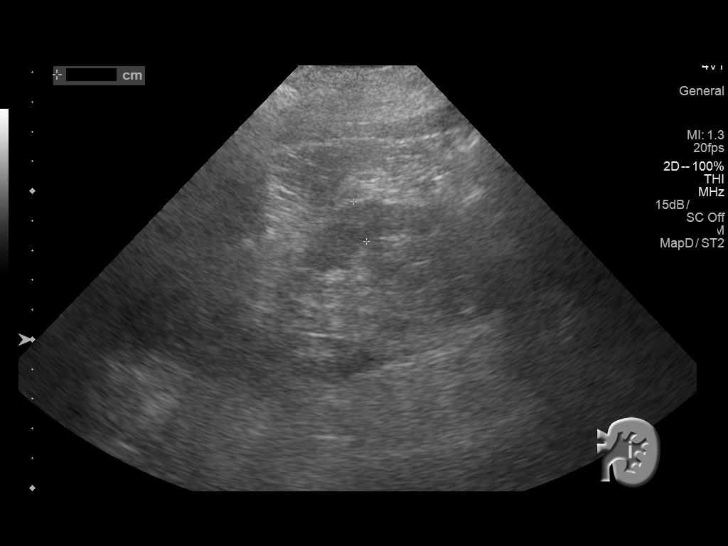
[im 33/50]
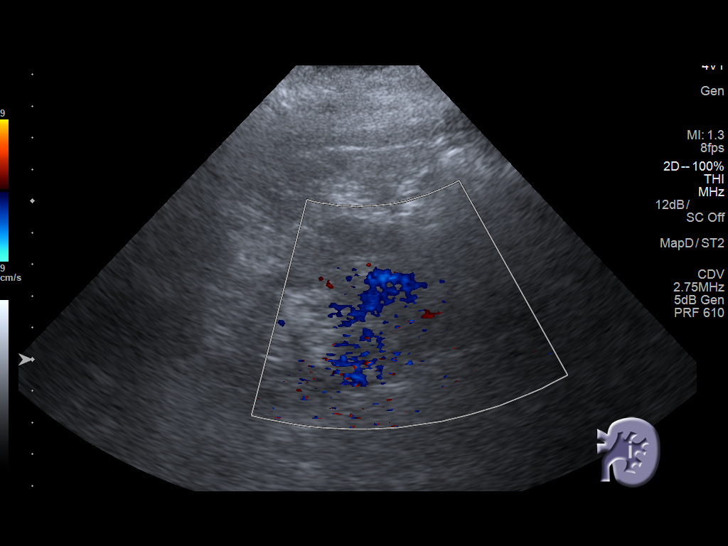
[im 37/50]
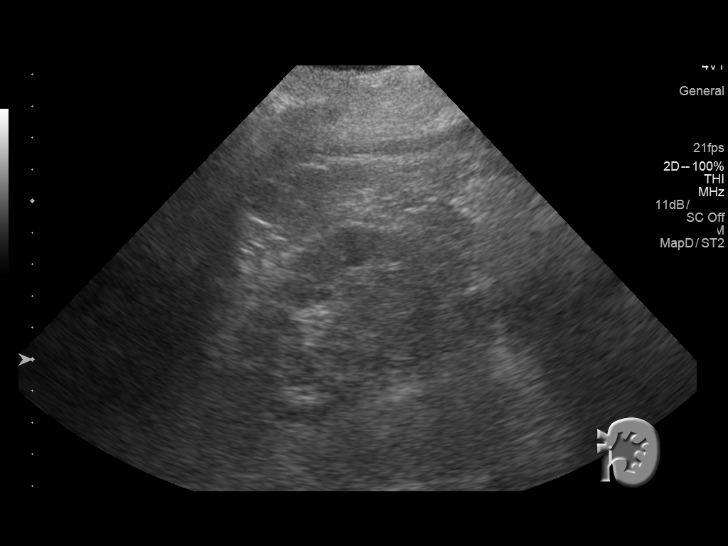
[im 41/50]
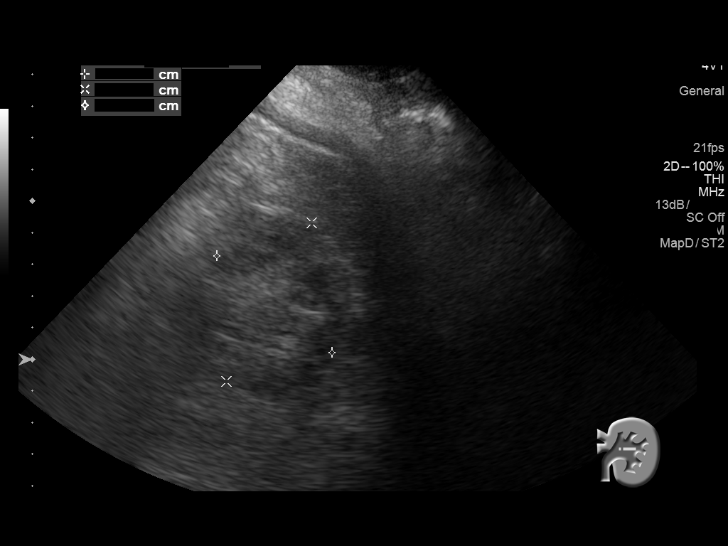
[im 45/50]
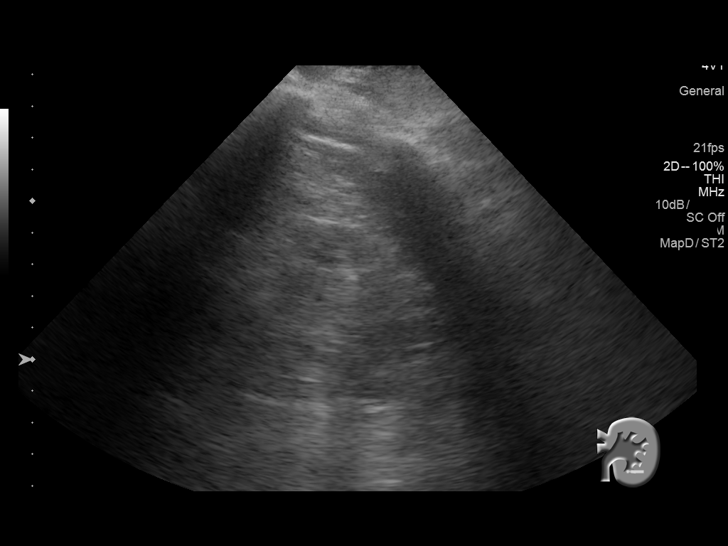
[im 50/50]
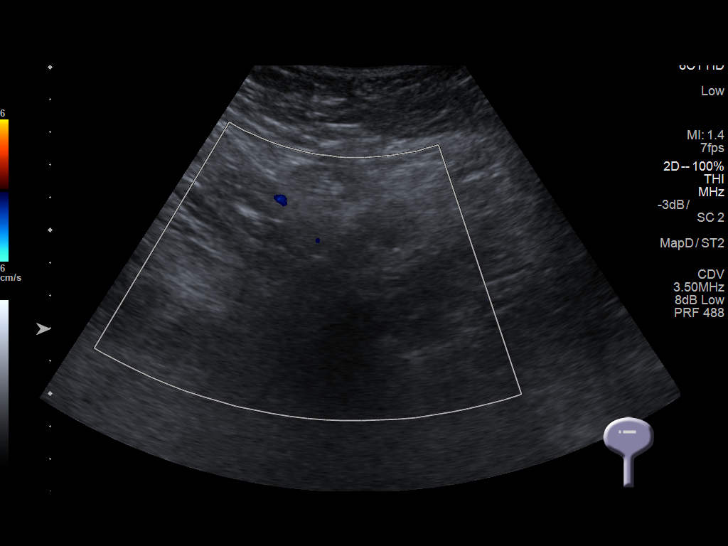

[14 of 25 positions shown; findings below may reference images not displayed]

FINDINGS: Right Kidney:

Renal measurements: 7.9 x 3.6 x 4.3 cm = volume: 65 mL. Renal
cortical thinning with borderline increased parenchymal
echogenicity. No mass or hydronephrosis identified.

Left Kidney:

Renal measurements: 8.3 x 4.8 x 5.7 cm = volume: 118 mL. Mildly
increased parenchymal echogenicity. No mass or hydronephrosis
identified.

Bladder:

Not visualized.
IMPRESSION: Evidence of medical renal disease without hydronephrosis.
